# Patient Record
Sex: Male | Born: 1970 | Race: Asian | Hispanic: No | Marital: Married | Smoking: Never smoker
Health system: Southern US, Community
[De-identification: ages and names within clinical notes are randomized; demographics above are authoritative.]

## PROBLEM LIST (undated history)

## (undated) DIAGNOSIS — E7800 Pure hypercholesterolemia, unspecified: Secondary | ICD-10-CM

## (undated) DIAGNOSIS — I10 Essential (primary) hypertension: Secondary | ICD-10-CM

## (undated) HISTORY — PX: OTHER SURGICAL HISTORY: SHX169

## (undated) HISTORY — PX: KIDNEY CYST REMOVAL: SHX684

---

## 2005-05-30 ENCOUNTER — Ambulatory Visit: Payer: Self-pay | Admitting: Internal Medicine

## 2005-06-05 ENCOUNTER — Ambulatory Visit: Payer: Self-pay | Admitting: Internal Medicine

## 2005-06-11 ENCOUNTER — Ambulatory Visit: Payer: Self-pay | Admitting: Cardiology

## 2005-06-27 ENCOUNTER — Ambulatory Visit: Payer: Self-pay | Admitting: Internal Medicine

## 2005-07-19 ENCOUNTER — Ambulatory Visit: Payer: Self-pay | Admitting: Internal Medicine

## 2005-07-19 ENCOUNTER — Encounter: Payer: Self-pay | Admitting: Emergency Medicine

## 2005-07-20 ENCOUNTER — Ambulatory Visit: Payer: Self-pay | Admitting: Internal Medicine

## 2005-07-20 ENCOUNTER — Observation Stay (HOSPITAL_COMMUNITY): Admission: AD | Admit: 2005-07-20 | Discharge: 2005-07-21 | Payer: Self-pay | Admitting: Internal Medicine

## 2005-07-25 ENCOUNTER — Ambulatory Visit: Payer: Self-pay | Admitting: Internal Medicine

## 2005-07-26 ENCOUNTER — Emergency Department (HOSPITAL_COMMUNITY): Admission: EM | Admit: 2005-07-26 | Discharge: 2005-07-26 | Payer: Self-pay | Admitting: Emergency Medicine

## 2005-08-06 ENCOUNTER — Ambulatory Visit: Payer: Self-pay | Admitting: Pulmonary Disease

## 2005-08-08 ENCOUNTER — Ambulatory Visit: Payer: Self-pay | Admitting: Internal Medicine

## 2005-08-31 ENCOUNTER — Ambulatory Visit (HOSPITAL_COMMUNITY): Admission: RE | Admit: 2005-08-31 | Discharge: 2005-09-01 | Payer: Self-pay | Admitting: Otolaryngology

## 2005-09-13 ENCOUNTER — Ambulatory Visit: Payer: Self-pay | Admitting: Internal Medicine

## 2005-09-20 ENCOUNTER — Ambulatory Visit: Payer: Self-pay | Admitting: Internal Medicine

## 2005-09-24 ENCOUNTER — Ambulatory Visit (HOSPITAL_COMMUNITY): Admission: RE | Admit: 2005-09-24 | Discharge: 2005-09-24 | Payer: Self-pay | Admitting: Internal Medicine

## 2005-10-03 ENCOUNTER — Ambulatory Visit: Payer: Self-pay

## 2005-10-10 ENCOUNTER — Ambulatory Visit: Payer: Self-pay | Admitting: Internal Medicine

## 2007-12-09 ENCOUNTER — Encounter: Payer: Self-pay | Admitting: *Deleted

## 2007-12-09 DIAGNOSIS — J019 Acute sinusitis, unspecified: Secondary | ICD-10-CM | POA: Insufficient documentation

## 2007-12-09 DIAGNOSIS — Z8669 Personal history of other diseases of the nervous system and sense organs: Secondary | ICD-10-CM | POA: Insufficient documentation

## 2007-12-09 DIAGNOSIS — F411 Generalized anxiety disorder: Secondary | ICD-10-CM | POA: Insufficient documentation

## 2007-12-09 DIAGNOSIS — J309 Allergic rhinitis, unspecified: Secondary | ICD-10-CM | POA: Insufficient documentation

## 2010-12-28 ENCOUNTER — Emergency Department (INDEPENDENT_AMBULATORY_CARE_PROVIDER_SITE_OTHER): Payer: BC Managed Care – PPO

## 2010-12-28 ENCOUNTER — Emergency Department (HOSPITAL_BASED_OUTPATIENT_CLINIC_OR_DEPARTMENT_OTHER)
Admission: EM | Admit: 2010-12-28 | Discharge: 2010-12-28 | Disposition: A | Discharge status: Other Acute Inpatient Hospital | Payer: BC Managed Care – PPO | Attending: Emergency Medicine | Admitting: Emergency Medicine

## 2010-12-28 DIAGNOSIS — R079 Chest pain, unspecified: Secondary | ICD-10-CM | POA: Insufficient documentation

## 2010-12-28 DIAGNOSIS — I10 Essential (primary) hypertension: Secondary | ICD-10-CM | POA: Insufficient documentation

## 2010-12-28 LAB — COMPREHENSIVE METABOLIC PANEL
ALT: 17 U/L (ref 0–53)
AST: 16 U/L (ref 0–37)
CO2: 25 mEq/L (ref 19–32)
Calcium: 9.6 mg/dL (ref 8.4–10.5)
GFR calc Af Amer: >60 mL/min (ref >60)
GFR calc non Af Amer: >60 mL/min (ref >60)
Potassium: 3.6 mEq/L (ref 3.5–5.1)
Sodium: 135 mEq/L (ref 135–145)

## 2010-12-28 LAB — TROPONIN I: Troponin I: <0.3 ng/mL (ref <0.30)

## 2010-12-28 LAB — CK TOTAL AND CKMB (NOT AT ARMC)
CK, MB: 0.8 ng/mL (ref 0.3–4.0)
Total CK: 76 U/L (ref 7–232)

## 2010-12-28 LAB — DIFFERENTIAL
Basophils Absolute: 0.1 10*3/uL (ref 0.0–0.1)
Basophils Relative: 1 % (ref 0–1)
Neutro Abs: 3.5 10*3/uL (ref 1.7–7.7)
Neutrophils Relative %: 54 % (ref 43–77)

## 2010-12-28 LAB — CBC
Hemoglobin: 14.3 g/dL (ref 13.0–17.0)
MCHC: 35.3 g/dL (ref 30.0–36.0)
RBC: 4.71 MIL/uL (ref 4.22–5.81)
WBC: 6.4 10*3/uL (ref 4.0–10.5)

## 2010-12-28 LAB — LIPASE, BLOOD: Lipase: 21 U/L (ref 11–59)

## 2017-05-09 ENCOUNTER — Emergency Department (HOSPITAL_BASED_OUTPATIENT_CLINIC_OR_DEPARTMENT_OTHER): Payer: BLUE CROSS/BLUE SHIELD

## 2017-05-09 ENCOUNTER — Emergency Department (HOSPITAL_BASED_OUTPATIENT_CLINIC_OR_DEPARTMENT_OTHER)
Admission: EM | Admit: 2017-05-09 | Discharge: 2017-05-09 | Disposition: A | Discharge status: Home / Self Care | Payer: BLUE CROSS/BLUE SHIELD | Attending: Emergency Medicine | Admitting: Emergency Medicine

## 2017-05-09 ENCOUNTER — Encounter (HOSPITAL_BASED_OUTPATIENT_CLINIC_OR_DEPARTMENT_OTHER): Payer: Self-pay | Admitting: Emergency Medicine

## 2017-05-09 DIAGNOSIS — R531 Weakness: Secondary | ICD-10-CM | POA: Insufficient documentation

## 2017-05-09 DIAGNOSIS — I10 Essential (primary) hypertension: Secondary | ICD-10-CM | POA: Insufficient documentation

## 2017-05-09 DIAGNOSIS — R42 Dizziness and giddiness: Secondary | ICD-10-CM | POA: Diagnosis present

## 2017-05-09 HISTORY — DX: Essential (primary) hypertension: I10

## 2017-05-09 HISTORY — DX: Pure hypercholesterolemia, unspecified: E78.00

## 2017-05-09 LAB — CBC WITH DIFFERENTIAL/PLATELET
Basophils Absolute: 0.1 10*3/uL (ref 0.0–0.1)
Basophils Relative: 2 %
EOS ABS: 0.8 10*3/uL — AB (ref 0.0–0.7)
EOS PCT: 15 %
HCT: 41.5 % (ref 39.0–52.0)
HEMOGLOBIN: 14.5 g/dL (ref 13.0–17.0)
LYMPHS ABS: 1.8 10*3/uL (ref 0.7–4.0)
LYMPHS PCT: 33 %
MCH: 30.9 pg (ref 26.0–34.0)
MCHC: 34.9 g/dL (ref 30.0–36.0)
MCV: 88.3 fL (ref 78.0–100.0)
MONOS PCT: 11 %
Monocytes Absolute: 0.6 10*3/uL (ref 0.1–1.0)
NEUTROS PCT: 39 %
Neutro Abs: 2.2 10*3/uL (ref 1.7–7.7)
Platelets: 206 10*3/uL (ref 150–400)
RBC: 4.7 MIL/uL (ref 4.22–5.81)
RDW: 13.8 % (ref 11.5–15.5)
WBC: 5.5 10*3/uL (ref 4.0–10.5)

## 2017-05-09 LAB — URINALYSIS, ROUTINE W REFLEX MICROSCOPIC
Bilirubin Urine: NEGATIVE
GLUCOSE, UA: NEGATIVE mg/dL
HGB URINE DIPSTICK: NEGATIVE
Ketones, ur: NEGATIVE mg/dL
Leukocytes, UA: NEGATIVE
Nitrite: NEGATIVE
Protein, ur: NEGATIVE mg/dL
Specific Gravity, Urine: 1.01 (ref 1.005–1.030)
pH: 7.5 (ref 5.0–8.0)

## 2017-05-09 LAB — COMPREHENSIVE METABOLIC PANEL
ALT: 30 U/L (ref 17–63)
AST: 34 U/L (ref 15–41)
Albumin: 4.3 g/dL (ref 3.5–5.0)
Alkaline Phosphatase: 48 U/L (ref 38–126)
Anion gap: 7 (ref 5–15)
BUN: 6 mg/dL (ref 6–20)
CHLORIDE: 97 mmol/L — AB (ref 101–111)
CO2: 27 mmol/L (ref 22–32)
CREATININE: 0.99 mg/dL (ref 0.61–1.24)
Calcium: 8.9 mg/dL (ref 8.9–10.3)
GFR calc Af Amer: >60 mL/min (ref >60)
GFR calc non Af Amer: >60 mL/min (ref >60)
Glucose, Bld: 96 mg/dL (ref 65–99)
Potassium: 3.8 mmol/L (ref 3.5–5.1)
Sodium: 131 mmol/L — ABNORMAL LOW (ref 135–145)
Total Bilirubin: 0.7 mg/dL (ref 0.3–1.2)
Total Protein: 7.9 g/dL (ref 6.5–8.1)

## 2017-05-09 LAB — RAPID URINE DRUG SCREEN, HOSP PERFORMED
Amphetamines: NOT DETECTED
BARBITURATES: NOT DETECTED
Benzodiazepines: NOT DETECTED
Cocaine: NOT DETECTED
Opiates: NOT DETECTED
Tetrahydrocannabinol: NOT DETECTED

## 2017-05-09 LAB — PROTIME-INR
INR: 1.03
PROTHROMBIN TIME: 13.4 s (ref 11.4–15.2)

## 2017-05-09 LAB — TROPONIN I: Troponin I: <0.03 ng/mL (ref <0.03)

## 2017-05-09 LAB — ETHANOL

## 2017-05-09 LAB — CBG MONITORING, ED: Glucose-Capillary: 85 mg/dL (ref 65–99)

## 2017-05-09 LAB — APTT: APTT: 29 s (ref 24–36)

## 2017-05-09 MED ORDER — SODIUM CHLORIDE 0.9 % IV BOLUS (SEPSIS)
1000.0000 mL | Freq: Once | INTRAVENOUS | Status: AC
  Administered 2017-05-09: 1000 mL via INTRAVENOUS

## 2017-05-09 MED ORDER — MECLIZINE HCL 25 MG PO TABS
25.0000 mg | ORAL_TABLET | Freq: Once | ORAL | Status: AC
  Administered 2017-05-09: 25 mg via ORAL
  Filled 2017-05-09: qty 1

## 2017-05-09 MED ORDER — MECLIZINE HCL 25 MG PO TABS: 25.0000 mg | ORAL_TABLET | Freq: Three times a day (TID) | ORAL | 0 refills | Status: DC | PRN

## 2017-05-09 MED ORDER — SODIUM CHLORIDE 0.9 % IV SOLN: INTRAVENOUS | Status: DC

## 2017-05-09 NOTE — ED Provider Notes (Addendum)
[facility] Provider Note   CSN: [id number] Arrival date & time: 05/09/17  1440     History   Chief Complaint Chief Complaint  Patient presents with  . Dizziness    HPI Robert Garrett is a 46 y.o. male.  Patient with acute onset of dizziness without room spinning at 2:00 this afternoon. Associated with feeling of being weak in both legs. No back pain no neck pain no headache no chest pain no abdominal pain. No fevers. Did have earlier in the week some chest congestion and cough. Did use albuterol because he felt he was maybe having some wheezing. This is been present for about 2 weeks. Patient denies any speech problems. No history of anything similar. No visual changes.      Past Medical History:  Diagnosis Date  . Hypercholesteremia   . Hypertension     Patient Active Problem List   Diagnosis Date Noted  . ANXIETY 12/09/2007  . SINUSITIS, ACUTE 12/09/2007  . ALLERGIC RHINITIS 12/09/2007  . VERTIGO, HX OF 12/09/2007    Past Surgical History:  Procedure Laterality Date  . sinus surgery         Home Medications    Prior to Admission medications   Medication Sig Start Date End Date Taking? Authorizing Provider  losartan-hydrochlorothiazide (HYZAAR) 50-12.5 MG tablet Take 1 tablet by mouth daily.   Yes [provider]  meclizine (ANTIVERT) 25 MG tablet Take 1 tablet (25 mg total) by mouth 3 (three) times daily as needed for dizziness. 05/09/17   Fredia Sorrow, MD    Family History History reviewed. No pertinent family history.  Social History Social History  Substance Use Topics  . Smoking status: Never Smoker  . Smokeless tobacco: Never Used  . Alcohol use Yes     Comment: rarely     Allergies   Patient has no known allergies.   Review of Systems Review of Systems  Constitutional: Negative for fever.  HENT: Positive for congestion.   Eyes: Negative for visual disturbance.  Respiratory: Positive for cough and wheezing.     Cardiovascular: Negative for chest pain.  Gastrointestinal: Negative for abdominal pain.  Genitourinary: Negative for dysuria.  Musculoskeletal: Negative for myalgias.  Neurological: Positive for dizziness, weakness and light-headedness. Negative for seizures, syncope, facial asymmetry, speech difficulty, numbness and headaches.  Hematological: Does not bruise/bleed easily.  Psychiatric/Behavioral: Negative for confusion.     Physical Exam Updated Vital Signs BP (!) 131/97   Pulse 87   Temp 98.1 F (36.7 C)   Resp 16   Ht 1.702 m (5\' 7" )   Wt 90.7 kg (200 lb)   SpO2 95%   BMI 31.32 kg/m   Physical Exam  Constitutional: He is oriented to person, place, and time. He appears well-developed and well-nourished. No distress.  HENT:  Head: Normocephalic and atraumatic.  Mouth/Throat: Oropharynx is clear and moist.  Eyes: Pupils are equal, round, and reactive to light. Conjunctivae and EOM are normal.  No nystagmus.  Neck: Normal range of motion. Neck supple.  Cardiovascular: Normal rate, regular rhythm and normal heart sounds.   Pulmonary/Chest: Effort normal and breath sounds normal. He has no wheezes.  Abdominal: Soft. Bowel sounds are normal.  Musculoskeletal: Normal range of motion. He exhibits no edema.  Neurological: He is alert and oriented to person, place, and time. No cranial nerve deficit. He exhibits abnormal muscle tone. Coordination normal.  Mild bilateral upper extremity and lower extremity weakness.  Skin: Skin is warm. No rash  noted.  Nursing note and vitals reviewed.    ED Treatments / Results  Labs (all labs ordered are listed, but only abnormal results are displayed) Labs Reviewed  COMPREHENSIVE METABOLIC PANEL - Abnormal; Notable for the following:       Result Value   Sodium 131 (*)    Chloride 97 (*)    All other components within normal limits  CBC WITH DIFFERENTIAL/PLATELET - Abnormal; Notable for the following:    Eosinophils Absolute 0.8 (*)     All other components within normal limits  URINALYSIS, ROUTINE W REFLEX MICROSCOPIC  ETHANOL  PROTIME-INR  APTT  TROPONIN I  RAPID URINE DRUG SCREEN, HOSP PERFORMED  CBG MONITORING, ED    EKG  EKG Interpretation  Date/Time:  Thursday May 09 2017 15:07:06 EDT Ventricular Rate:  85 PR Interval:    QRS Duration: 100 QT Interval:  372 QTC Calculation: 443 R Axis:   89 Text Interpretation:  Sinus rhythm Abnormal R-wave progression, early transition ST elevation, consider inferior injury No significant change since last tracing Confirmed by Fredia Sorrow [phone]) on 05/09/2017 4:54:12 PM       Radiology Dg Chest 2 View  Result Date: 05/09/2017 CLINICAL DATA:  Acute onset dizziness. EXAM: CHEST  2 VIEW COMPARISON:  07/22/2016 FINDINGS: The lungs are clear without focal pneumonia, edema, pneumothorax or pleural effusion. The cardiopericardial silhouette is within normal limits for size. The visualized bony structures of the thorax are intact. Telemetry leads overlie the chest. IMPRESSION: No active cardiopulmonary disease. Electronically Signed   By: Misty Stanley M.D.   On: 05/09/2017 16:48   Ct Head Wo Contrast  Result Date: 05/09/2017 CLINICAL DATA:  Dizziness. EXAM: CT HEAD WITHOUT CONTRAST TECHNIQUE: Contiguous axial images were obtained from the base of the skull through the vertex without intravenous contrast. COMPARISON:  MRI brain dated September 24, 2005. CT head dated July 20, 2005. FINDINGS: Brain: No evidence of acute infarction, hemorrhage, hydrocephalus, extra-axial collection or mass lesion/mass effect. Vascular: No hyperdense vessel or unexpected calcification. Skull: Normal. Negative for fracture or focal lesion. Sinuses/Orbits: Pansinusitis and polyposis again noted. Small air-fluid level in the right maxillary sinus. The left sphenoid sinus is relatively clear. The mastoid air cells are clear. The orbits are unremarkable. Other: None. IMPRESSION: 1.  No acute  intracranial abnormality. 2. Pain sinusitis and polyposis again noted with small air-fluid level in the right maxillary sinus. Electronically Signed   By: Titus Dubin M.D.   On: 05/09/2017 16:51    Procedures Procedures (including critical care time)  Medications Ordered in ED Medications  0.9 %  sodium chloride infusion (not administered)  sodium chloride 0.9 % bolus 1,000 mL (1,000 mLs Intravenous New Bag/Given 05/09/17 1533)  meclizine (ANTIVERT) tablet 25 mg (25 mg Oral Given 05/09/17 1552)     Initial Impression / Assessment and Plan / ED Course  I have reviewed the triage vital signs and the nursing notes.  Pertinent labs & imaging results that were available during my care of the patient were reviewed by me and considered in my medical decision making (see chart for details).    Patient with symptoms during the week suggestive of perhaps a viral upper respiratory infection or some exacerbation of reactive airway disease. Patient with dizziness and felt as if he was given a fall over but no room spinning. No complaint of any pain. Head CT negative chest x-ray negative for pneumonia labs without significant abnormalities other than some mild hyponatremia. Symptoms could be viral based.  Patient felt better after fluids and meclizine. Patient able to ambulate without any symptoms.  Patient informed that stroke not completely ruled out. The patient without any other symptoms other than the dizziness did have some mild lower extremity weakness but also had some mild upper extremity weakness both bilaterally. Feel that stroke is unlikely. MRI not available do not feel patient needs to be transferred to have MRI. Patient will follow-up with primary care doctor. Patient will be continued on the meclizine.   Final Clinical Impressions(s) / ED Diagnoses   Final diagnoses:  Dizziness    New Prescriptions New Prescriptions   MECLIZINE (ANTIVERT) 25 MG TABLET    Take 1 tablet (25 mg  total) by mouth 3 (three) times daily as needed for dizziness.     Fredia Sorrow, MD 05/09/17 1810    Fredia Sorrow, MD 05/09/17 Vernelle Emerald

## 2017-05-09 NOTE — ED Notes (Signed)
Pt ambulated around the unit without any difficulty or dizziness with this NT (Sneha Willig). RN (Joss) notified.

## 2017-05-09 NOTE — Discharge Instructions (Signed)
Make appointment to follow-up with your regular doctor. 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 Take it easy. Return for any new or worse symptoms. Chest x-ray negative head CT negative. Symptoms may be secondary to a virus. Try to increase fluid intake.

## 2017-05-09 NOTE — ED Triage Notes (Signed)
Patient states that he was at work and started to develop acute onset of dizziness. The patient denies any other complaints. Reports that he has a "cough" x 2 weeks. Denies any Chest pain, SOB - Neuro intact with the exception of the dizziness. Denies any Headache

## 2017-05-09 NOTE — ED Notes (Signed)
Family at bedside. 

## 2017-05-09 NOTE — ED Notes (Signed)
ED Provider at bedside. 

## 2018-10-17 IMAGING — CR DG CHEST 2V
2 series · 2 of 2 positions shown · non-contrast
Comparison: 07/22/2016

CLINICAL DATA: Acute onset dizziness.

EXAM:
CHEST  2 VIEW

[w chest pa]
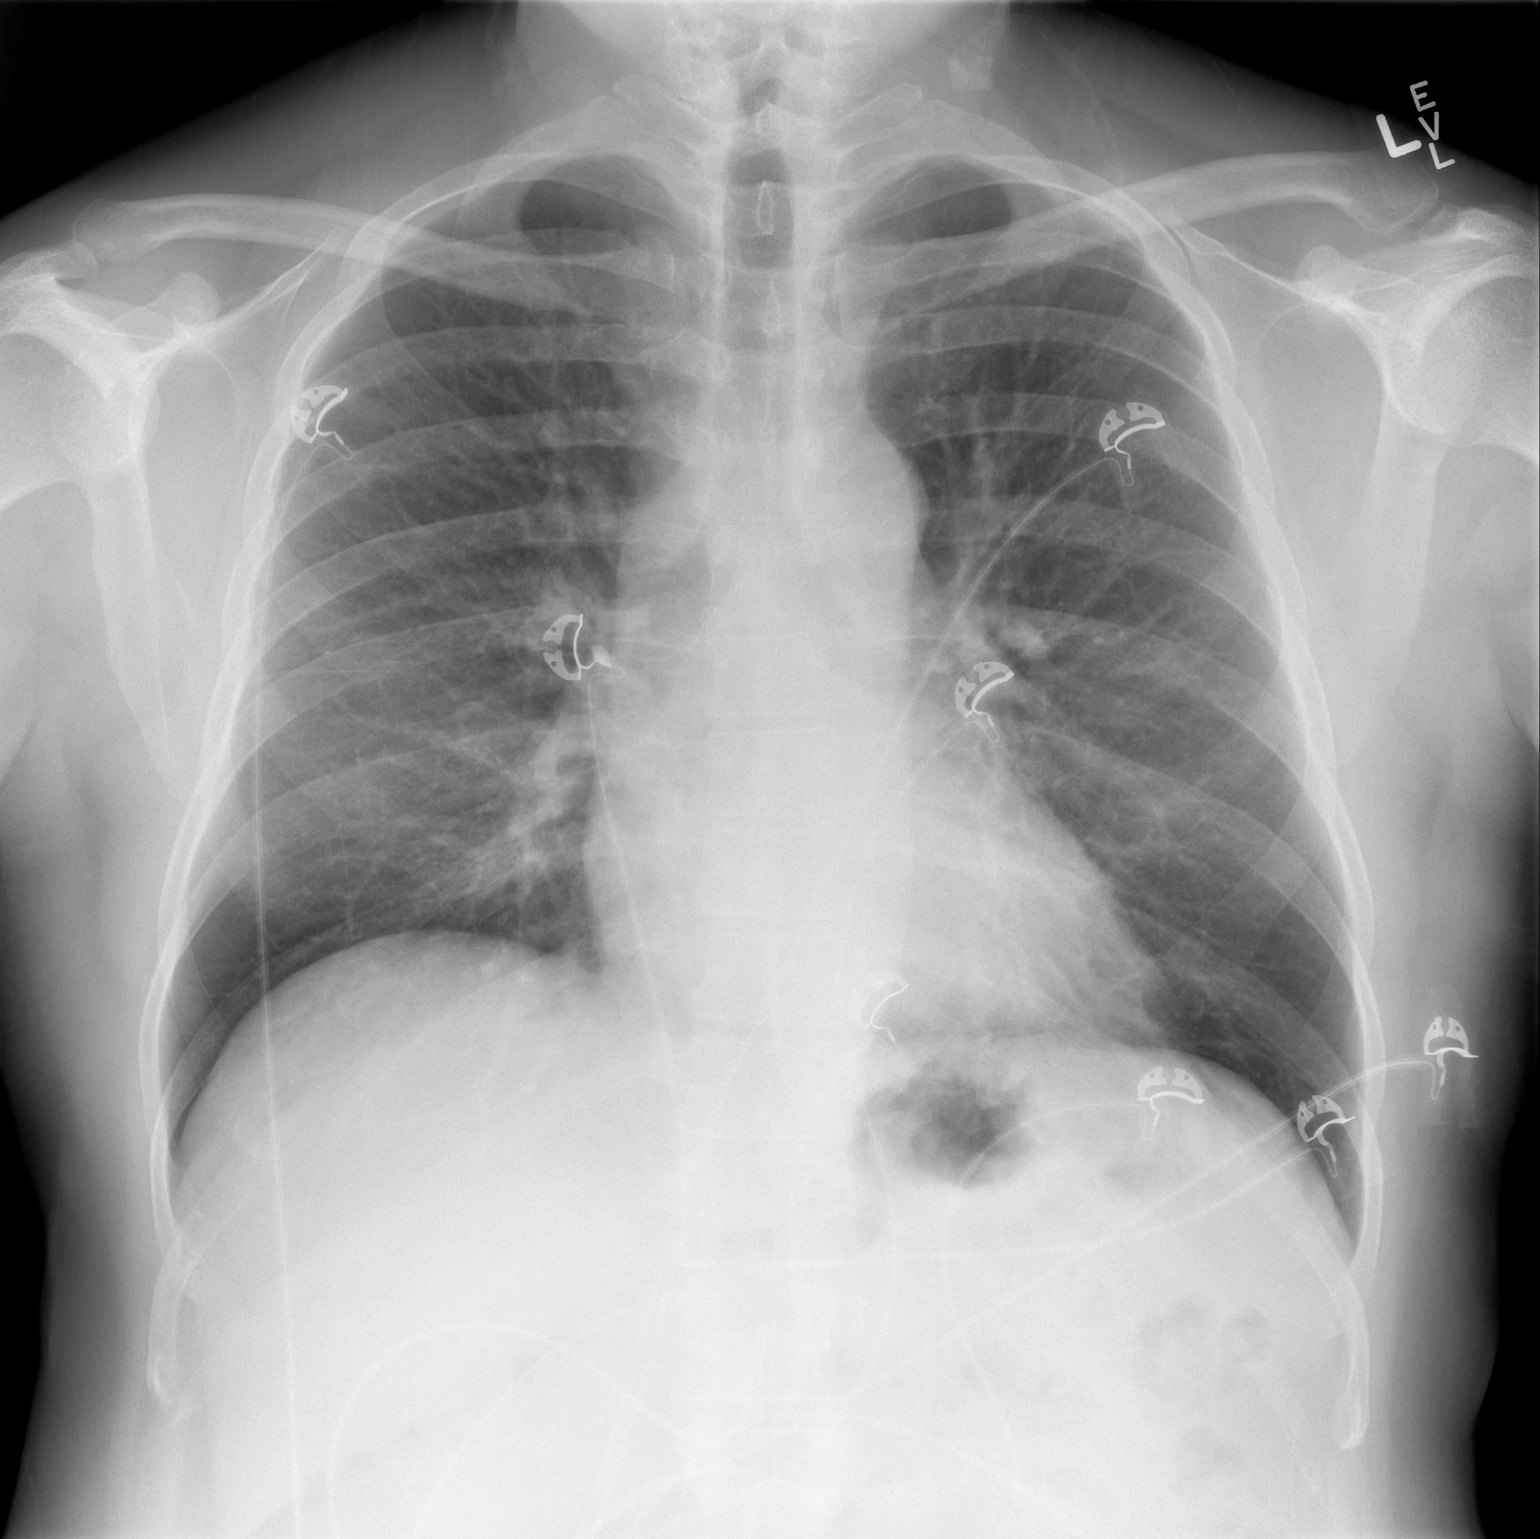

[w chest lat]
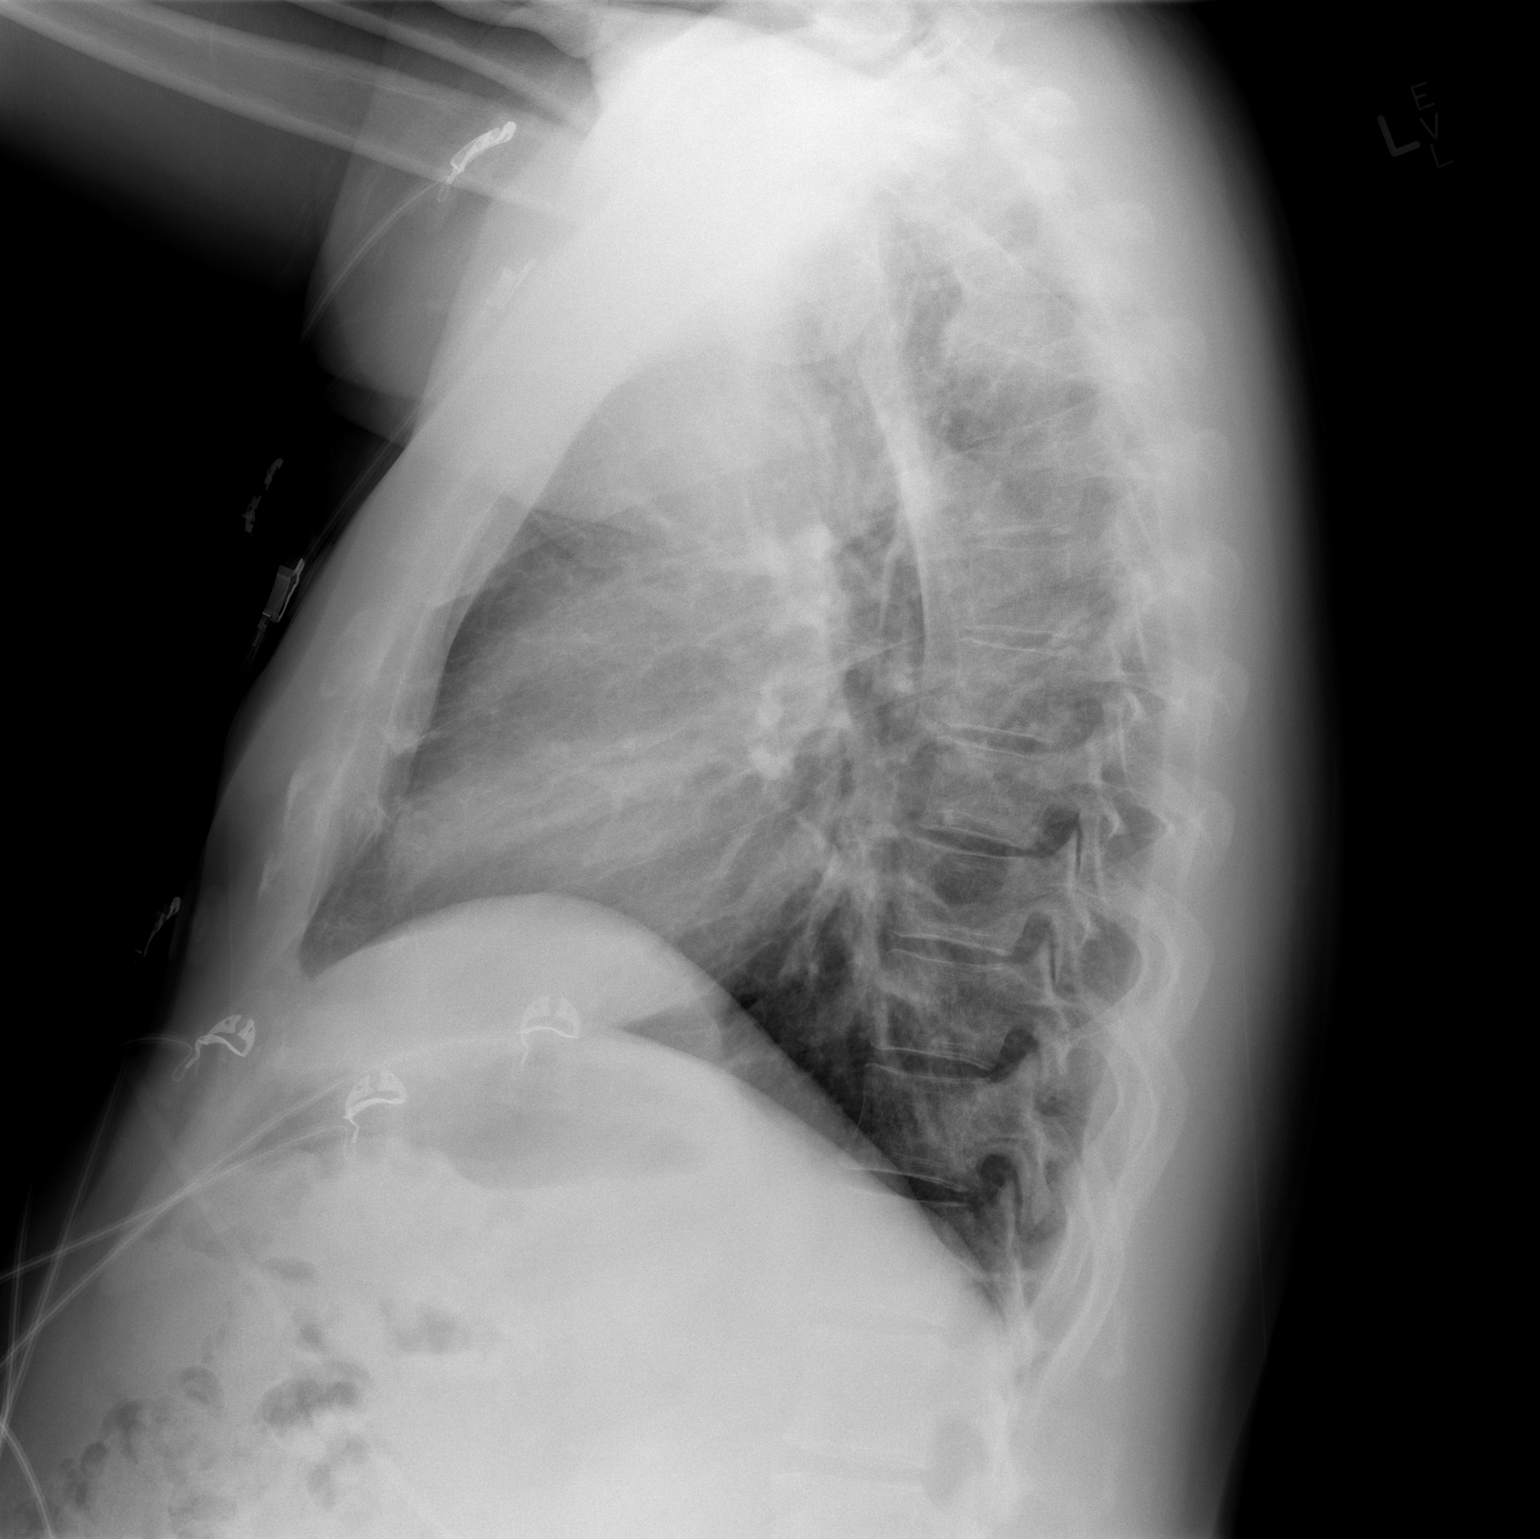

[2 of 2 positions shown; findings below may reference images not displayed]

FINDINGS: The lungs are clear without focal pneumonia, edema, pneumothorax or
pleural effusion. The cardiopericardial silhouette is within normal
limits for size. The visualized bony structures of the thorax are
intact. Telemetry leads overlie the chest.
IMPRESSION: No active cardiopulmonary disease.

## 2018-10-17 IMAGING — CT CT HEAD W/O CM
3 series · 16 of 47 positions shown, 19 images · non-contrast
Comparison: MRI brain dated September 24, 2005. CT head dated
July 20, 2005.

CLINICAL DATA: Dizziness.

EXAM:
CT HEAD WITHOUT CONTRAST
TECHNIQUE: Contiguous axial images were obtained from the base of the skull
through the vertex without intravenous contrast.

[Series 2: head wo · axial · 0.41mm/px · z∈[+1237,+1377]mm · 10 of 34 slices shown, 13 images]
[im 3/34  brain]
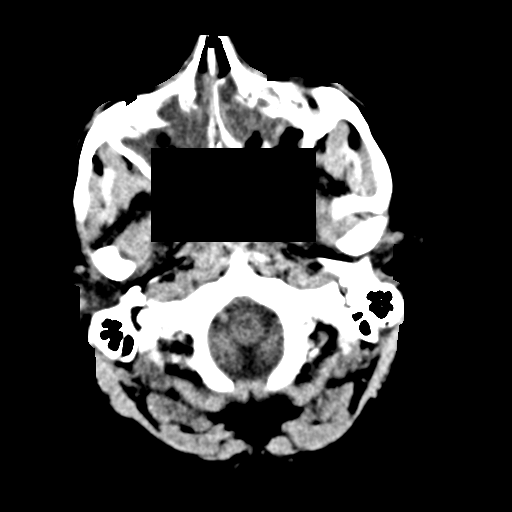
[im 3/34  bone]
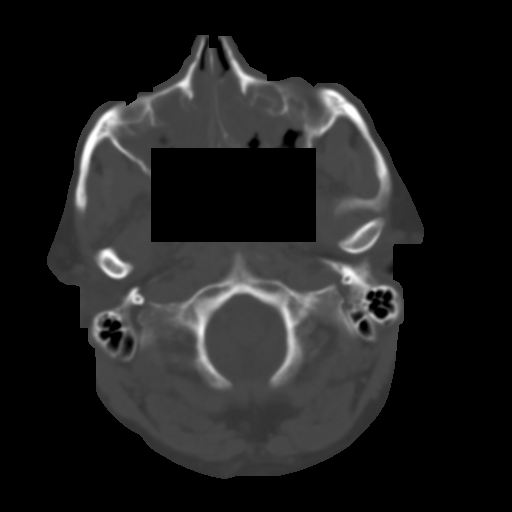
[im 6/34  brain]
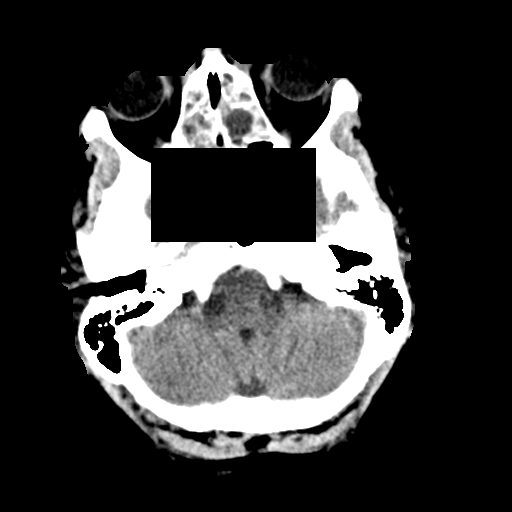
[im 10/34  brain]
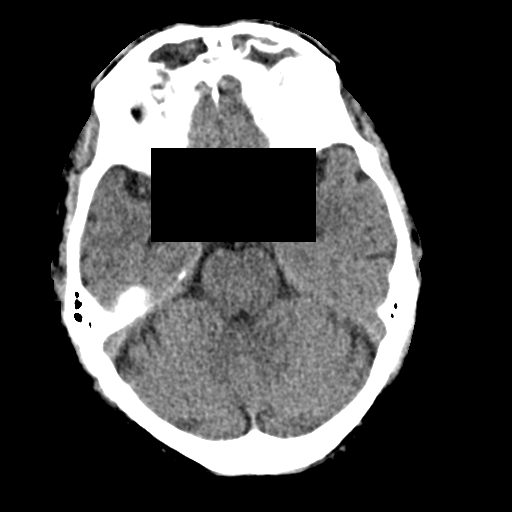
[im 12/34  brain]
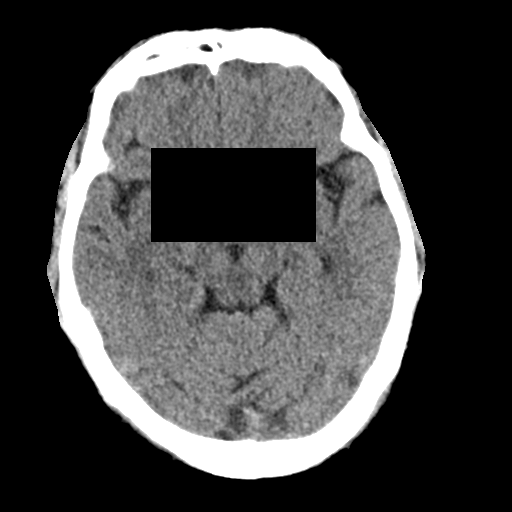
[im 15/34  brain]
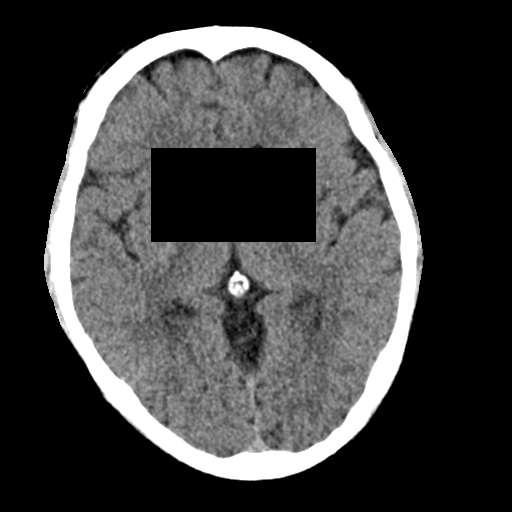
[im 15/34  bone]
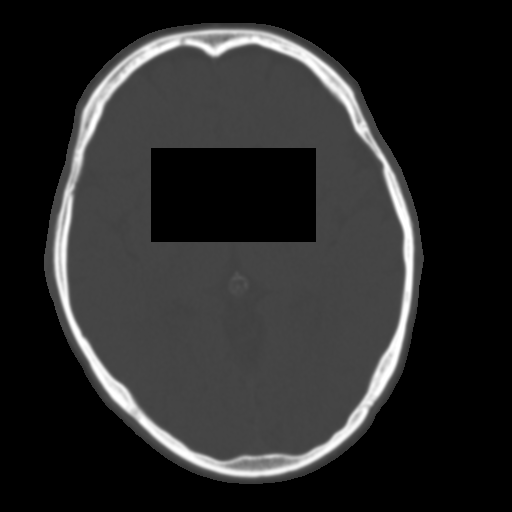
[im 19/34  brain]
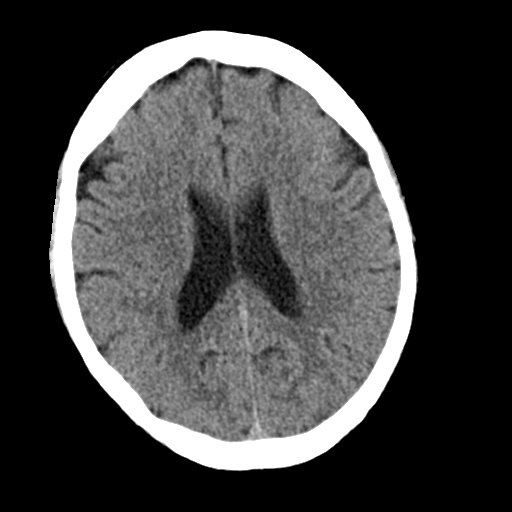
[im 22/34  brain]
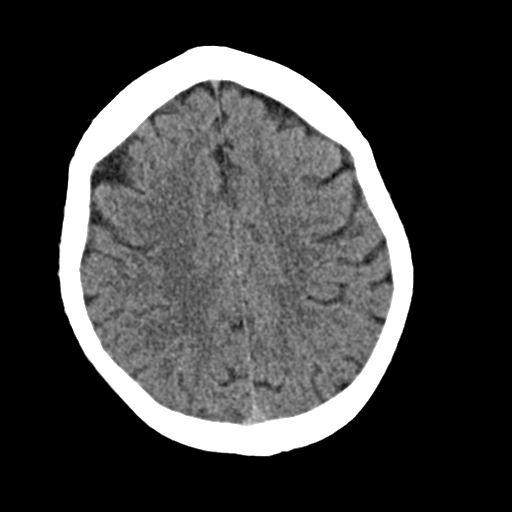
[im 26/34  brain]
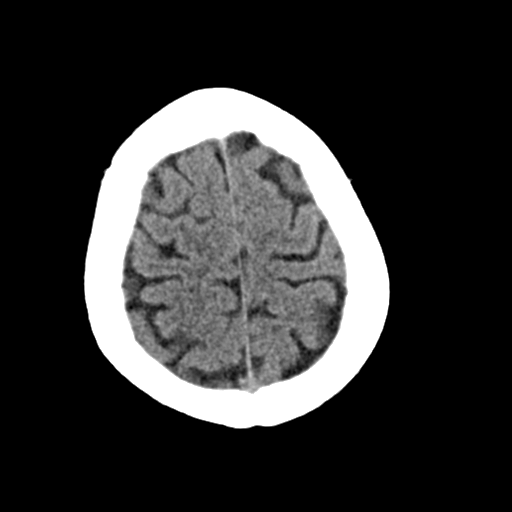
[im 28/34  brain]
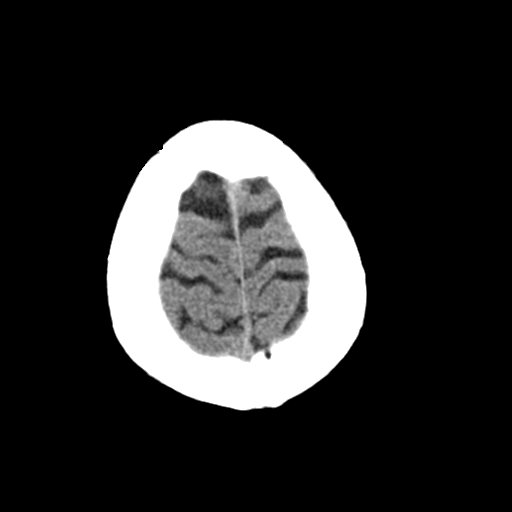
[im 28/34  bone]
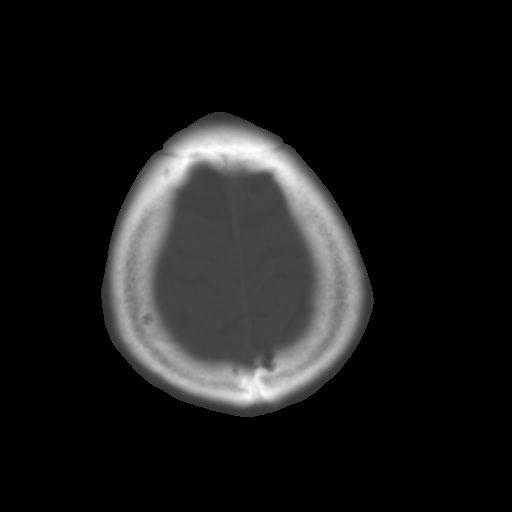
[im 31/34  brain]
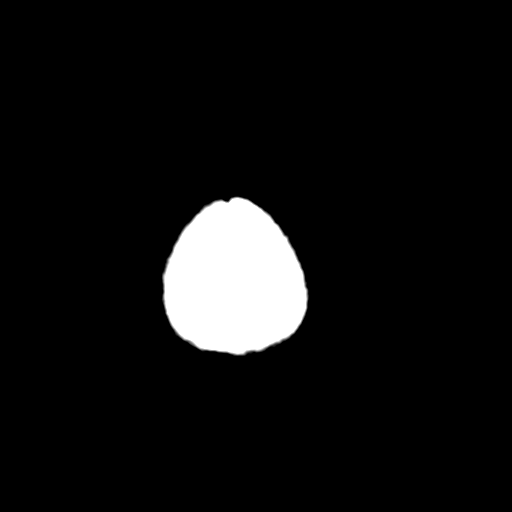

[Series 4: coronal soft · coronal · 0.33mm/px · 3 of 67 slices shown]
[im 23/67  brain]
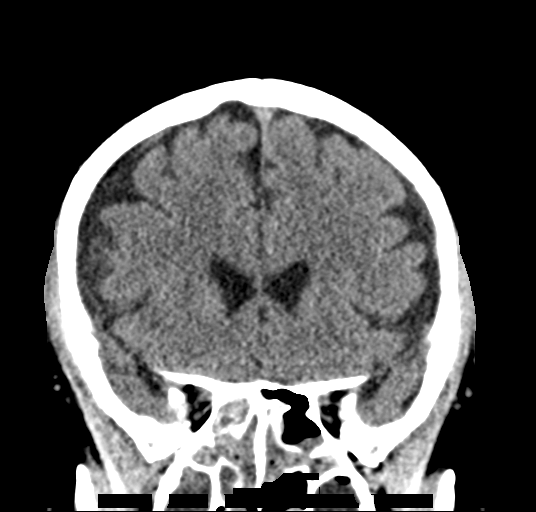
[im 30/67  brain]
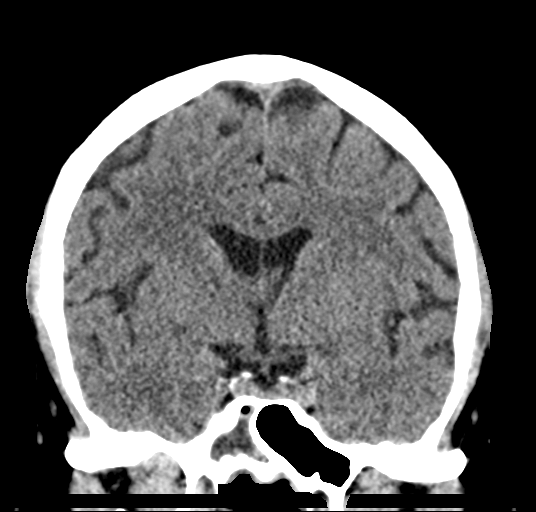
[im 37/67  brain]
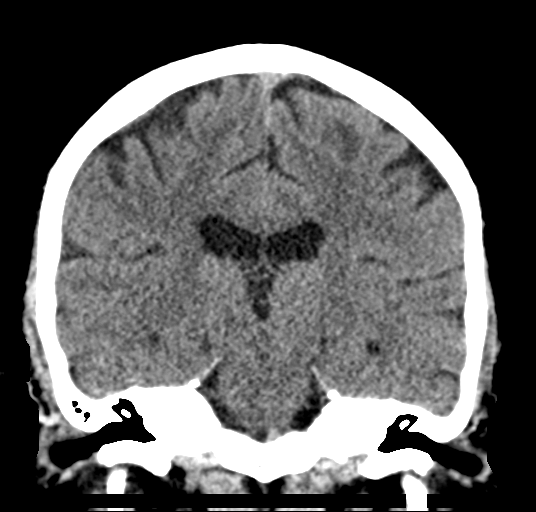

[Series 5: sag soft · sagittal · 0.33mm/px · 3 of 60 slices shown]
[im 20/60  brain]
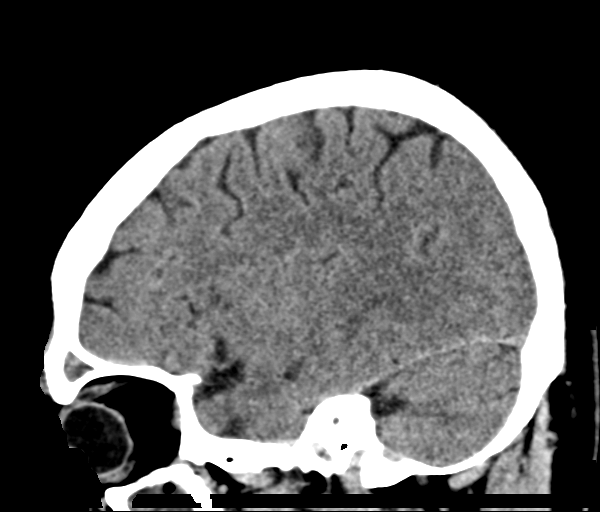
[im 30/60  brain]
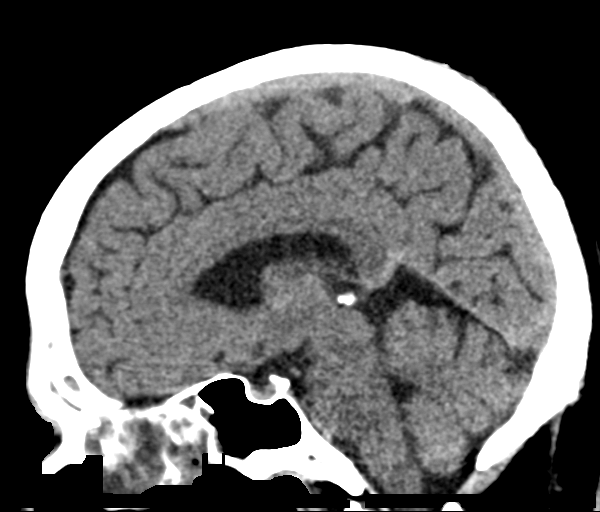
[im 40/60  brain]
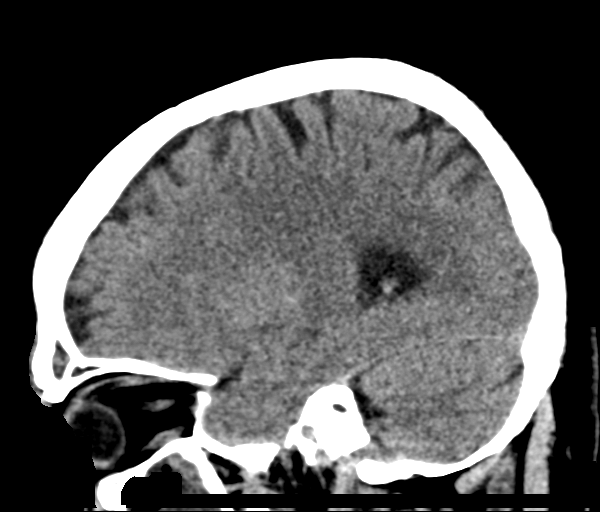

[16 of 47 positions shown; findings below may reference images not displayed]

FINDINGS: Brain: No evidence of acute infarction, hemorrhage, hydrocephalus,
extra-axial collection or mass lesion/mass effect.

Vascular: No hyperdense vessel or unexpected calcification.

Skull: Normal. Negative for fracture or focal lesion.

Sinuses/Orbits: Pansinusitis and polyposis again noted. Small
air-fluid level in the right maxillary sinus. The left sphenoid
sinus is relatively clear. The mastoid air cells are clear. The
orbits are unremarkable.

Other: None.
IMPRESSION: 1.  No acute intracranial abnormality.
2. Pain sinusitis and polyposis again noted with small air-fluid
level in the right maxillary sinus.

## 2020-10-20 ENCOUNTER — Encounter (HOSPITAL_BASED_OUTPATIENT_CLINIC_OR_DEPARTMENT_OTHER): Payer: Self-pay

## 2020-10-20 ENCOUNTER — Emergency Department (HOSPITAL_BASED_OUTPATIENT_CLINIC_OR_DEPARTMENT_OTHER): Payer: BC Managed Care – PPO

## 2020-10-20 ENCOUNTER — Other Ambulatory Visit: Payer: Self-pay

## 2020-10-20 ENCOUNTER — Emergency Department (HOSPITAL_BASED_OUTPATIENT_CLINIC_OR_DEPARTMENT_OTHER)
Admission: EM | Admit: 2020-10-20 | Discharge: 2020-10-20 | Disposition: A | Discharge status: Home / Self Care | Payer: BC Managed Care – PPO | Attending: Emergency Medicine | Admitting: Emergency Medicine

## 2020-10-20 DIAGNOSIS — R42 Dizziness and giddiness: Secondary | ICD-10-CM | POA: Diagnosis not present

## 2020-10-20 DIAGNOSIS — R112 Nausea with vomiting, unspecified: Secondary | ICD-10-CM

## 2020-10-20 DIAGNOSIS — R000 Tachycardia, unspecified: Secondary | ICD-10-CM | POA: Insufficient documentation

## 2020-10-20 DIAGNOSIS — R61 Generalized hyperhidrosis: Secondary | ICD-10-CM | POA: Diagnosis not present

## 2020-10-20 DIAGNOSIS — Z20822 Contact with and (suspected) exposure to covid-19: Secondary | ICD-10-CM | POA: Insufficient documentation

## 2020-10-20 DIAGNOSIS — R1013 Epigastric pain: Secondary | ICD-10-CM

## 2020-10-20 DIAGNOSIS — Z79899 Other long term (current) drug therapy: Secondary | ICD-10-CM | POA: Insufficient documentation

## 2020-10-20 DIAGNOSIS — E876 Hypokalemia: Secondary | ICD-10-CM | POA: Diagnosis not present

## 2020-10-20 DIAGNOSIS — R197 Diarrhea, unspecified: Secondary | ICD-10-CM | POA: Insufficient documentation

## 2020-10-20 DIAGNOSIS — I10 Essential (primary) hypertension: Secondary | ICD-10-CM | POA: Insufficient documentation

## 2020-10-20 LAB — BASIC METABOLIC PANEL
Anion gap: 13 (ref 5–15)
BUN: 7 mg/dL (ref 6–20)
CO2: 26 mmol/L (ref 22–32)
Calcium: 9.3 mg/dL (ref 8.9–10.3)
Chloride: 94 mmol/L — ABNORMAL LOW (ref 98–111)
Creatinine, Ser: 0.99 mg/dL (ref 0.61–1.24)
GFR, Estimated: >60 mL/min (ref >60)
Glucose, Bld: 101 mg/dL — ABNORMAL HIGH (ref 70–99)
Potassium: 3.1 mmol/L — ABNORMAL LOW (ref 3.5–5.1)
Sodium: 133 mmol/L — ABNORMAL LOW (ref 135–145)

## 2020-10-20 LAB — CBC
HCT: 46.1 % (ref 39.0–52.0)
Hemoglobin: 15.7 g/dL (ref 13.0–17.0)
MCH: 30.3 pg (ref 26.0–34.0)
MCHC: 34.1 g/dL (ref 30.0–36.0)
MCV: 89 fL (ref 80.0–100.0)
Platelets: 246 10*3/uL (ref 150–400)
RBC: 5.18 MIL/uL (ref 4.22–5.81)
RDW: 13.6 % (ref 11.5–15.5)
WBC: 9.4 10*3/uL (ref 4.0–10.5)
nRBC: 0 % (ref 0.0–0.2)

## 2020-10-20 LAB — HEPATIC FUNCTION PANEL
ALT: 19 U/L (ref 0–44)
AST: 28 U/L (ref 15–41)
Albumin: 5.1 g/dL — ABNORMAL HIGH (ref 3.5–5.0)
Alkaline Phosphatase: 43 U/L (ref 38–126)
Bilirubin, Direct: 0.2 mg/dL (ref 0.0–0.2)
Indirect Bilirubin: 0.7 mg/dL (ref 0.3–0.9)
Total Bilirubin: 0.9 mg/dL (ref 0.3–1.2)
Total Protein: 8.9 g/dL — ABNORMAL HIGH (ref 6.5–8.1)

## 2020-10-20 LAB — TROPONIN I (HIGH SENSITIVITY): Troponin I (High Sensitivity): <2 ng/L (ref <18)

## 2020-10-20 LAB — LIPASE, BLOOD: Lipase: 32 U/L (ref 11–51)

## 2020-10-20 MED ORDER — POTASSIUM CHLORIDE ER 10 MEQ PO TBCR: 10.0000 meq | EXTENDED_RELEASE_TABLET | Freq: Two times a day (BID) | ORAL | 0 refills | Status: DC

## 2020-10-20 MED ORDER — DICYCLOMINE HCL 20 MG PO TABS: 20.0000 mg | ORAL_TABLET | Freq: Three times a day (TID) | ORAL | 0 refills | Status: DC | PRN

## 2020-10-20 MED ORDER — ONDANSETRON HCL 4 MG/2ML IJ SOLN
4.0000 mg | Freq: Once | INTRAMUSCULAR | Status: AC
  Administered 2020-10-20: 4 mg via INTRAVENOUS
  Filled 2020-10-20: qty 2

## 2020-10-20 MED ORDER — SODIUM CHLORIDE 0.9 % IV SOLN: INTRAVENOUS | Status: DC | PRN

## 2020-10-20 MED ORDER — ONDANSETRON 4 MG PO TBDP: 4.0000 mg | ORAL_TABLET | Freq: Three times a day (TID) | ORAL | 0 refills | Status: DC | PRN

## 2020-10-20 MED ORDER — POTASSIUM CHLORIDE 10 MEQ/100ML IV SOLN
10.0000 meq | Freq: Once | INTRAVENOUS | Status: AC
  Administered 2020-10-20: 10 meq via INTRAVENOUS
  Filled 2020-10-20: qty 100

## 2020-10-20 MED ORDER — SODIUM CHLORIDE 0.9 % IV BOLUS
1000.0000 mL | Freq: Once | INTRAVENOUS | Status: AC
  Administered 2020-10-20: 1000 mL via INTRAVENOUS

## 2020-10-20 NOTE — ED Notes (Signed)
ED Provider at bedside. 

## 2020-10-20 NOTE — ED Provider Notes (Signed)
[facility] DEPART[facility] Provider Note   CSN: [id number] Arrival date & time: 10/20/20  1519     History Chief Complaint  Patient presents with  . Weakness  . Abdominal Pain    Robert Garrett is a 50 y.o. male.  HPI Patient presents with nausea vomiting dizziness/lightheadedness.  Began today while coming home from lunch.  Reportedly began to feel lightheaded.  Felt as if he could pass out.  Then began to have vomiting.  Has not been around anyone sick.  No blood in the emesis.  No he has been vaccinated for COVID.  No known sick contacts.  Somewhat difficult history since patient is actively vomiting.  History also comes from the patient's wife.    Past Medical History:  Diagnosis Date  . Hypercholesteremia   . Hypertension     Patient Active Problem List   Diagnosis Date Noted  . ANXIETY 12/09/2007  . SINUSITIS, ACUTE 12/09/2007  . ALLERGIC RHINITIS 12/09/2007  . VERTIGO, HX OF 12/09/2007    Past Surgical History:  Procedure Laterality Date  . sinus surgery         No family history on file.  Social History   Tobacco Use  . Smoking status: Never Smoker  . Smokeless tobacco: Never Used  Substance Use Topics  . Alcohol use: Yes    Comment: rarely  . Drug use: No    Home Medications Prior to Admission medications   Medication Sig Start Date End Date Taking? Authorizing Provider  dicyclomine (BENTYL) 20 MG tablet Take 1 tablet (20 mg total) by mouth 3 (three) times daily as needed for spasms. 10/20/20  Yes Davonna Belling, MD  ondansetron (ZOFRAN-ODT) 4 MG disintegrating tablet Take 1 tablet (4 mg total) by mouth every 8 (eight) hours as needed for nausea or vomiting. 10/20/20  Yes Davonna Belling, MD  potassium chloride (KLOR-CON) 10 MEQ tablet Take 1 tablet (10 mEq total) by mouth 2 (two) times daily. 10/20/20  Yes Davonna Belling, MD  losartan-hydrochlorothiazide (HYZAAR) 50-12.5 MG tablet Take 1 tablet by mouth daily.    [provider]  meclizine (ANTIVERT) 25 MG tablet Take 1 tablet (25 mg total) by mouth 3 (three) times daily as needed for dizziness. 05/09/17   Fredia Sorrow, MD    Allergies    Patient has no known allergies.  Review of Systems   Review of Systems  Constitutional: Positive for appetite change and diaphoresis.  HENT: Negative for congestion.   Respiratory: Negative for shortness of breath.   Cardiovascular: Negative for chest pain.  Gastrointestinal: Positive for diarrhea, nausea and vomiting.  Genitourinary: Negative for flank pain.  Musculoskeletal: Negative for back pain.  Skin: Negative for rash.  Neurological: Positive for light-headedness.  Psychiatric/Behavioral: Negative for confusion.    Physical Exam Updated Vital Signs BP 134/87   Pulse 85   Temp 97.9 F (36.6 C) (Oral)   Resp 20   Ht 5\' 7"  (1.702 m)   Wt 96.6 kg   SpO2 95%   BMI 33.36 kg/m   Physical Exam Vitals reviewed.  Constitutional:      Comments: Patient is actively vomiting   Cardiovascular:     Rate and Rhythm: Tachycardia present.  Pulmonary:     Effort: Pulmonary effort is normal.  Abdominal:     Tenderness: There is no abdominal tenderness.     Hernia: No hernia is present.  Skin:    General: Skin is warm.     Capillary Refill: Capillary  refill takes less than 2 seconds.  Neurological:     Mental Status: He is alert and oriented to person, place, and time.     ED Results / Procedures / Treatments   Labs (all labs ordered are listed, but only abnormal results are displayed) Labs Reviewed  BASIC METABOLIC PANEL - Abnormal; Notable for the following components:      Result Value   Sodium 133 (*)    Potassium 3.1 (*)    Chloride 94 (*)    Glucose, Bld 101 (*)    All other components within normal limits  HEPATIC FUNCTION PANEL - Abnormal; Notable for the following components:   Total Protein 8.9 (*)    Albumin 5.1 (*)    All other components within normal limits  SARS  CORONAVIRUS 2 (TAT 6-24 HRS)  CBC  LIPASE, BLOOD  TROPONIN I (HIGH SENSITIVITY)    EKG EKG Interpretation  Date/Time:  Thursday October 20 2020 15:22:11 EST Ventricular Rate:  96 PR Interval:  152 QRS Duration: 92 QT Interval:  372 QTC Calculation: 469 R Axis:   101 Text Interpretation: Normal sinus rhythm Possible Right ventricular hypertrophy Nonspecific ST abnormality Abnormal ECG Baseline wander Confirmed by Davonna Belling [id number] on 10/20/2020 3:45:42 PM   Radiology DG Chest Port 1 View  Result Date: 10/20/2020 CLINICAL DATA:  Epigastric abdominal pain. EXAM: PORTABLE CHEST 1 VIEW COMPARISON:  05/26/2020 FINDINGS: Normal sized heart.  Clear lungs.  Unremarkable bones. IMPRESSION: No acute abnormality. Electronically Signed   By: Claudie Revering M.D.   On: 10/20/2020 16:28    Procedures Procedures   Medications Ordered in ED Medications  0.9 %  sodium chloride infusion (has no administration in time range)  ondansetron (ZOFRAN) injection 4 mg (4 mg Intravenous Given 10/20/20 1607)  sodium chloride 0.9 % bolus 1,000 mL ( Intravenous Stopped 10/20/20 1753)  potassium chloride 10 mEq in 100 mL IVPB (0 mEq Intravenous Stopped 10/20/20 1829)    ED Course  I have reviewed the triage vital signs and the nursing notes.  Pertinent labs & imaging results that were available during my care of the patient were reviewed by me and considered in my medical decision making (see chart for details).    MDM Rules/Calculators/A&P                          Patient presents with nausea vomiting diaphoresis and a little diarrhea.  Began acutely while driving home for lunch.  Lab work showed mild hypokalemia and hyponatremia.  Supplemented with fluids and potassium.  Feels somewhat better now tolerating orals.  Doubt cardiac cause of the vomiting.  Will discharge home with treatments for the cramping antiemetics and potassium.  Outpatient follow-up as needed.  Likely gastroenteritis or other cause.   Covid test has been done to be followed as an outpatient.  Patient has been vaccinated for COVID.  Does not appear to have bowel obstruction or cardiac cause of the vomiting. Final Clinical Impression(s) / ED Diagnoses Final diagnoses:  Non-intractable vomiting with nausea, unspecified vomiting type  Hypokalemia    Rx / DC Orders ED Discharge Orders         Ordered    dicyclomine (BENTYL) 20 MG tablet  3 times daily PRN        10/20/20 1808    ondansetron (ZOFRAN-ODT) 4 MG disintegrating tablet  Every 8 hours PRN        10/20/20 1809    potassium chloride (  KLOR-CON) 10 MEQ tablet  2 times daily        10/20/20 1810           Davonna Belling, MD 10/20/20 1932

## 2020-10-20 NOTE — ED Notes (Signed)
Per EDP order, pt given fluids and/or food for PO challenge. Pt verbalized understanding to utilize call bell if nausea or emesis occur. 

## 2020-10-21 LAB — SARS CORONAVIRUS 2 (TAT 6-24 HRS): SARS Coronavirus 2: NEGATIVE

## 2021-05-09 ENCOUNTER — Encounter (HOSPITAL_BASED_OUTPATIENT_CLINIC_OR_DEPARTMENT_OTHER): Payer: Self-pay | Admitting: *Deleted

## 2021-05-09 ENCOUNTER — Emergency Department (HOSPITAL_BASED_OUTPATIENT_CLINIC_OR_DEPARTMENT_OTHER): Payer: BC Managed Care – PPO

## 2021-05-09 ENCOUNTER — Inpatient Hospital Stay (HOSPITAL_BASED_OUTPATIENT_CLINIC_OR_DEPARTMENT_OTHER)
Admission: EM | Admit: 2021-05-09 | Discharge: 2021-05-11 | DRG: 644 | Disposition: A | Discharge status: Home / Self Care | Payer: BC Managed Care – PPO | Attending: Internal Medicine | Admitting: Internal Medicine

## 2021-05-09 ENCOUNTER — Other Ambulatory Visit: Payer: Self-pay

## 2021-05-09 DIAGNOSIS — N2889 Other specified disorders of kidney and ureter: Secondary | ICD-10-CM

## 2021-05-09 DIAGNOSIS — E669 Obesity, unspecified: Secondary | ICD-10-CM | POA: Diagnosis present

## 2021-05-09 DIAGNOSIS — E785 Hyperlipidemia, unspecified: Secondary | ICD-10-CM | POA: Diagnosis not present

## 2021-05-09 DIAGNOSIS — R519 Headache, unspecified: Secondary | ICD-10-CM | POA: Diagnosis not present

## 2021-05-09 DIAGNOSIS — Z6833 Body mass index (BMI) 33.0-33.9, adult: Secondary | ICD-10-CM

## 2021-05-09 DIAGNOSIS — D49512 Neoplasm of unspecified behavior of left kidney: Secondary | ICD-10-CM | POA: Diagnosis not present

## 2021-05-09 DIAGNOSIS — Z905 Acquired absence of kidney: Secondary | ICD-10-CM | POA: Diagnosis not present

## 2021-05-09 DIAGNOSIS — Z79899 Other long term (current) drug therapy: Secondary | ICD-10-CM | POA: Diagnosis not present

## 2021-05-09 DIAGNOSIS — E876 Hypokalemia: Secondary | ICD-10-CM | POA: Diagnosis not present

## 2021-05-09 DIAGNOSIS — Z7951 Long term (current) use of inhaled steroids: Secondary | ICD-10-CM

## 2021-05-09 DIAGNOSIS — D201 Benign neoplasm of soft tissue of peritoneum: Secondary | ICD-10-CM | POA: Diagnosis not present

## 2021-05-09 DIAGNOSIS — N99842 Postprocedural seroma of a genitourinary system organ or structure following a genitourinary system procedure: Secondary | ICD-10-CM | POA: Diagnosis not present

## 2021-05-09 DIAGNOSIS — Z20822 Contact with and (suspected) exposure to covid-19: Secondary | ICD-10-CM | POA: Diagnosis not present

## 2021-05-09 DIAGNOSIS — Z833 Family history of diabetes mellitus: Secondary | ICD-10-CM | POA: Diagnosis not present

## 2021-05-09 DIAGNOSIS — T502X5A Adverse effect of carbonic-anhydrase inhibitors, benzothiadiazides and other diuretics, initial encounter: Secondary | ICD-10-CM | POA: Diagnosis not present

## 2021-05-09 DIAGNOSIS — I10 Essential (primary) hypertension: Secondary | ICD-10-CM | POA: Diagnosis not present

## 2021-05-09 DIAGNOSIS — E222 Syndrome of inappropriate secretion of antidiuretic hormone: Secondary | ICD-10-CM | POA: Diagnosis not present

## 2021-05-09 DIAGNOSIS — G4733 Obstructive sleep apnea (adult) (pediatric): Secondary | ICD-10-CM | POA: Diagnosis not present

## 2021-05-09 DIAGNOSIS — Y838 Other surgical procedures as the cause of abnormal reaction of the patient, or of later complication, without mention of misadventure at the time of the procedure: Secondary | ICD-10-CM | POA: Diagnosis not present

## 2021-05-09 DIAGNOSIS — E7800 Pure hypercholesterolemia, unspecified: Secondary | ICD-10-CM | POA: Diagnosis not present

## 2021-05-09 DIAGNOSIS — E860 Dehydration: Secondary | ICD-10-CM | POA: Diagnosis not present

## 2021-05-09 DIAGNOSIS — J45909 Unspecified asthma, uncomplicated: Secondary | ICD-10-CM | POA: Diagnosis not present

## 2021-05-09 DIAGNOSIS — E871 Hypo-osmolality and hyponatremia: Secondary | ICD-10-CM | POA: Diagnosis present

## 2021-05-09 DIAGNOSIS — N179 Acute kidney failure, unspecified: Secondary | ICD-10-CM | POA: Diagnosis not present

## 2021-05-09 LAB — BASIC METABOLIC PANEL
Anion gap: 10 (ref 5–15)
Anion gap: 11 (ref 5–15)
Anion gap: 8 (ref 5–15)
BUN: 10 mg/dL (ref 6–20)
BUN: 11 mg/dL (ref 6–20)
BUN: 9 mg/dL (ref 6–20)
CO2: 21 mmol/L — ABNORMAL LOW (ref 22–32)
CO2: 23 mmol/L (ref 22–32)
CO2: 24 mmol/L (ref 22–32)
Calcium: 6.8 mg/dL — ABNORMAL LOW (ref 8.9–10.3)
Calcium: 7.8 mg/dL — ABNORMAL LOW (ref 8.9–10.3)
Calcium: 8.7 mg/dL — ABNORMAL LOW (ref 8.9–10.3)
Chloride: 84 mmol/L — ABNORMAL LOW (ref 98–111)
Chloride: 85 mmol/L — ABNORMAL LOW (ref 98–111)
Chloride: 92 mmol/L — ABNORMAL LOW (ref 98–111)
Creatinine, Ser: 1.1 mg/dL (ref 0.61–1.24)
Creatinine, Ser: 1.29 mg/dL — ABNORMAL HIGH (ref 0.61–1.24)
Creatinine, Ser: 1.29 mg/dL — ABNORMAL HIGH (ref 0.61–1.24)
GFR, Estimated: >60 mL/min (ref >60)
GFR, Estimated: >60 mL/min (ref >60)
GFR, Estimated: >60 mL/min (ref >60)
Glucose, Bld: 76 mg/dL (ref 70–99)
Glucose, Bld: 81 mg/dL (ref 70–99)
Glucose, Bld: 90 mg/dL (ref 70–99)
Potassium: 2.5 mmol/L — CL (ref 3.5–5.1)
Potassium: 2.8 mmol/L — ABNORMAL LOW (ref 3.5–5.1)
Potassium: 3.1 mmol/L — ABNORMAL LOW (ref 3.5–5.1)
Sodium: 117 mmol/L — CL (ref 135–145)
Sodium: 120 mmol/L — ABNORMAL LOW (ref 135–145)
Sodium: 121 mmol/L — ABNORMAL LOW (ref 135–145)

## 2021-05-09 LAB — COMPREHENSIVE METABOLIC PANEL
ALT: 23 U/L (ref 0–44)
AST: 20 U/L (ref 15–41)
Albumin: 4.1 g/dL (ref 3.5–5.0)
Alkaline Phosphatase: 76 U/L (ref 38–126)
Anion gap: 13 (ref 5–15)
BUN: 11 mg/dL (ref 6–20)
CO2: 26 mmol/L (ref 22–32)
Calcium: 9.2 mg/dL (ref 8.9–10.3)
Chloride: 77 mmol/L — ABNORMAL LOW (ref 98–111)
Creatinine, Ser: 1.42 mg/dL — ABNORMAL HIGH (ref 0.61–1.24)
GFR, Estimated: >60 mL/min (ref >60)
Glucose, Bld: 94 mg/dL (ref 70–99)
Potassium: 3.4 mmol/L — ABNORMAL LOW (ref 3.5–5.1)
Sodium: 116 mmol/L — CL (ref 135–145)
Total Bilirubin: 0.6 mg/dL (ref 0.3–1.2)
Total Protein: 8.4 g/dL — ABNORMAL HIGH (ref 6.5–8.1)

## 2021-05-09 LAB — RESP PANEL BY RT-PCR (FLU A&B, COVID) ARPGX2
Influenza A by PCR: NEGATIVE
Influenza B by PCR: NEGATIVE
SARS Coronavirus 2 by RT PCR: NEGATIVE

## 2021-05-09 LAB — URINALYSIS, ROUTINE W REFLEX MICROSCOPIC
Bilirubin Urine: NEGATIVE
Glucose, UA: NEGATIVE mg/dL
Hgb urine dipstick: NEGATIVE
Ketones, ur: NEGATIVE mg/dL
Leukocytes,Ua: NEGATIVE
Nitrite: NEGATIVE
Protein, ur: NEGATIVE mg/dL
Specific Gravity, Urine: 1.015 (ref 1.005–1.030)
pH: 7 (ref 5.0–8.0)

## 2021-05-09 LAB — CBC WITH DIFFERENTIAL/PLATELET
Abs Immature Granulocytes: 0.03 10*3/uL (ref 0.00–0.07)
Basophils Absolute: 0.1 10*3/uL (ref 0.0–0.1)
Basophils Relative: 1 %
Eosinophils Absolute: 1.1 10*3/uL — ABNORMAL HIGH (ref 0.0–0.5)
Eosinophils Relative: 15 %
HCT: 38.5 % — ABNORMAL LOW (ref 39.0–52.0)
Hemoglobin: 14.1 g/dL (ref 13.0–17.0)
Immature Granulocytes: 0 %
Lymphocytes Relative: 17 %
Lymphs Abs: 1.3 10*3/uL (ref 0.7–4.0)
MCH: 30.6 pg (ref 26.0–34.0)
MCHC: 36.6 g/dL — ABNORMAL HIGH (ref 30.0–36.0)
MCV: 83.5 fL (ref 80.0–100.0)
Monocytes Absolute: 0.9 10*3/uL (ref 0.1–1.0)
Monocytes Relative: 11 %
Neutro Abs: 4.3 10*3/uL (ref 1.7–7.7)
Neutrophils Relative %: 56 %
Platelets: 339 10*3/uL (ref 150–400)
RBC: 4.61 MIL/uL (ref 4.22–5.81)
RDW: 12 % (ref 11.5–15.5)
WBC: 7.7 10*3/uL (ref 4.0–10.5)
nRBC: 0 % (ref 0.0–0.2)

## 2021-05-09 LAB — OSMOLALITY: Osmolality: 243 mOsm/kg — CL (ref 275–295)

## 2021-05-09 LAB — OSMOLALITY, URINE: Osmolality, Ur: 291 mOsm/kg — ABNORMAL LOW (ref 300–900)

## 2021-05-09 MED ORDER — SODIUM CHLORIDE 0.9 % IV BOLUS
1000.0000 mL | Freq: Once | INTRAVENOUS | Status: AC
  Administered 2021-05-09: 1000 mL via INTRAVENOUS

## 2021-05-09 MED ORDER — SODIUM CHLORIDE 0.9 % IV SOLN: Freq: Once | INTRAVENOUS | Status: AC

## 2021-05-09 MED ORDER — POTASSIUM CHLORIDE 10 MEQ/100ML IV SOLN
10.0000 meq | INTRAVENOUS | Status: DC
  Administered 2021-05-10: 10 meq via INTRAVENOUS
  Filled 2021-05-09: qty 100

## 2021-05-09 NOTE — ED Notes (Signed)
ED Provider at bedside. Yao MD 

## 2021-05-09 NOTE — ED Provider Notes (Signed)
[facility] EMERGENCY DEPARTMENT Provider Note   CSN: [id number] Arrival date & time: 05/09/21  1154     History No chief complaint on file.   Robert Garrett is a 50 y.o. male hx of HTN, HL, here with weakness, nausea, headache.  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  Patient states that over the last week or so, he has been having some nausea and headaches.  He states that he has poor appetite.  Denies any vomiting and is still passing gas and had normal bowel movement today.  Patient also has diffuse weakness so came here for further evaluation.  The history is provided by the patient.      Past Medical History:  Diagnosis Date   Hypercholesteremia    Hypertension     Patient Active Problem List   Diagnosis Date Noted   ANXIETY 12/09/2007   SINUSITIS, ACUTE 12/09/2007   ALLERGIC RHINITIS 12/09/2007   VERTIGO, HX OF 12/09/2007    Past Surgical History:  Procedure Laterality Date   KIDNEY CYST REMOVAL Left    removal of left kidney Left    sinus surgery         No family history on file.  Social History   Tobacco Use   Smoking status: Never   Smokeless tobacco: Never  Substance Use Topics   Alcohol use: Yes    Comment: rarely   Drug use: No    Home Medications Prior to Admission medications   Medication Sig Start Date End Date Taking? Authorizing Provider  albuterol (VENTOLIN HFA) 108 (90 Base) MCG/ACT inhaler Inhale into the lungs. 02/26/20  Yes [provider]  ADVAIR HFA 45-21 MCG/ACT inhaler Inhale into the lungs. 04/19/21   [provider]  dicyclomine (BENTYL) 20 MG tablet Take 1 tablet (20 mg total) by mouth 3 (three) times daily as needed for spasms. 10/20/20   Davonna Belling, MD  fluticasone (FLONASE) 50 MCG/ACT nasal spray Place 1 spray into both nostrils 2 (two) times daily. 04/20/21   [provider]   losartan-hydrochlorothiazide (HYZAAR) 50-12.5 MG tablet Take 1 tablet by mouth daily.    [provider]  meclizine (ANTIVERT) 25 MG tablet Take 1 tablet (25 mg total) by mouth 3 (three) times daily as needed for dizziness. 05/09/17   Fredia Sorrow, MD  ondansetron (ZOFRAN-ODT) 4 MG disintegrating tablet Take 1 tablet (4 mg total) by mouth every 8 (eight) hours as needed for nausea or vomiting. 10/20/20   Davonna Belling, MD  pantoprazole (PROTONIX) 40 MG tablet Take 40 mg by mouth 2 (two) times daily. 12/23/20   [provider]  potassium chloride (KLOR-CON) 10 MEQ tablet Take 1 tablet (10 mEq total) by mouth 2 (two) times daily. 10/20/20   Davonna Belling, MD  rosuvastatin (CRESTOR) 10 MG tablet Take 10 mg by mouth at bedtime. 04/20/21   [provider]    Allergies    Patient has no known allergies.  Review of Systems   Review of Systems  Neurological:  Positive for weakness.  All other systems reviewed and are negative.  Physical Exam Updated Vital Signs BP (!) 137/102   Pulse 78   Temp 98.1 F (36.7 C) (Oral)   Resp 18   Ht 5\' 7"  (1.702 m)   Wt 93.9 kg   SpO2 100%   BMI 32.42 kg/m   Physical Exam Vitals and nursing note reviewed.  Constitutional:  Comments: Dehydrated   HENT:     Head: Normocephalic.     Nose: Nose normal.     Mouth/Throat:     Mouth: Mucous membranes are dry.  Eyes:     Extraocular Movements: Extraocular movements intact.     Pupils: Pupils are equal, round, and reactive to light.  Cardiovascular:     Rate and Rhythm: Normal rate and regular rhythm.     Pulses: Normal pulses.     Heart sounds: Normal heart sounds.  Pulmonary:     Effort: Pulmonary effort is normal.     Breath sounds: Normal breath sounds.  Abdominal:     General: Abdomen is flat.     Palpations: Abdomen is soft.     Comments: Large vertical midline incision that is healing well.  No obvious abdominal tenderness or distention  Musculoskeletal:         General: Normal range of motion.     Cervical back: Normal range of motion and neck supple.  Skin:    General: Skin is warm.     Capillary Refill: Capillary refill takes less than 2 seconds.  Neurological:     General: No focal deficit present.     Mental Status: He is alert.  Psychiatric:        Mood and Affect: Mood normal.        Behavior: Behavior normal.    ED Results / Procedures / Treatments   Labs (all labs ordered are listed, but only abnormal results are displayed) Labs Reviewed  CBC WITH DIFFERENTIAL/PLATELET - Abnormal; Notable for the following components:      Result Value   HCT 38.5 (*)    MCHC 36.6 (*)    Eosinophils Absolute 1.1 (*)    All other components within normal limits  COMPREHENSIVE METABOLIC PANEL - Abnormal; Notable for the following components:   Sodium 116 (*)    Potassium 3.4 (*)    Chloride 77 (*)    Creatinine, Ser 1.42 (*)    Total Protein 8.4 (*)    All other components within normal limits  URINALYSIS, ROUTINE W REFLEX MICROSCOPIC - Abnormal; Notable for the following components:   Color, Urine STRAW (*)    All other components within normal limits  RESP PANEL BY RT-PCR (FLU A&B, COVID) [id number]  OSMOLALITY  OSMOLALITY, URINE    EKG None  Radiology No results found.  Procedures Procedures   CRITICAL CARE Performed by: Wandra Arthurs   Total critical care time: 30 minutes  Critical care time was exclusive of separately billable procedures and treating other patients.  Critical care was necessary to treat or prevent imminent or life-threatening deterioration.  Critical care was time spent personally by me on the following activities: development of treatment plan with patient and/or surrogate as well as nursing, discussions with consultants, evaluation of patient's response to treatment, examination of patient, obtaining history from patient or surrogate, ordering and performing treatments and interventions, ordering and  review of laboratory studies, ordering and review of radiographic studies, pulse oximetry and re-evaluation of patient's condition.   Medications Ordered in ED Medications  sodium chloride 0.9 % bolus 1,000 mL (has no administration in time range)    ED Course  I have reviewed the triage vital signs and the nursing notes.  Pertinent labs & imaging results that were available during my care of the patient were reviewed by me and considered in my medical decision making (see chart for details).    MDM Rules/Calculators/A&P  BURNS TIMSON is a 50 y.o. male who presenting with nausea and headache and weakness.  Consider small bowel obstruction versus electrolyte abnormalities versus dehydration.  Plan to get CBC and CMP and CT abdomen pelvis.  5:24 PM CT showed fluid collection about 5 cm in diameter around the L nephrectomy site. He has no fever or vomiting and no SBO on CT. I doubt abscess and likely postop seroma. Sodium is 116 likely from dehydration. CT head showed no brain mass.  I talked to [facility] transfer center.  Discussed with Dr. Waldemar Dickens. He request power share images and will talk to Dr. Carlis Abbott, his attending   6:58 PM Dr. Carlis Abbott called back. 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  Since there is no obvious postsurgical complication, he does not feel strongly that patient has to be transferred to [facility] does not have any beds right now.  Patient also request to go to [facility] and I also talked to transfer center regarding [place] and it was no beds either.  At this point, will admit patient to the hospitalist service at [facility].  Patient received normal saline bolus of 1 L and will be started on 150 cc/hr. We will check sodium levels every 6 hours to make sure we didn't over correct the sodium.   Final Clinical Impression(s) / ED Diagnoses Final diagnoses:  None     Rx / DC Orders ED Discharge Orders     None        Drenda Freeze, MD 05/09/21 1900

## 2021-05-09 NOTE — H&P (Signed)
[name]:[id number] DOB: Nov 04, 1970 DOA: 05/09/2021     PCP: [name], MD   Outpatient Specialists:   Surgeon Dr. Carlis Abbott   Patient arrived to ER on 05/09/21 at 1154 Referred by Attending Rise Patience, MD   Patient coming from: home Lives  With family    Chief Complaint: Headache fatigue   HPI: NIL Robert Garrett is a 50 y.o. male with medical history significant of    Malignant neoplasm of retroperitoneum   Benign neoplasm of left kidney   Retroperitoneal sarcoma  HTN, HLD  Presented with headache nausea vomiting no appetite decreased p.o. intake.  For the past 1 week. Able to pass gas normal bowel movement.   Recently was diagnosed with retroperitoneal mass and had left nephrectomy and retroperitoneal mass removal at [facility] on September 7.   Since Friday he had very hard time keeping any food down He has been drinking fluids Family has been trying to minimize salt and increased fluids like water   Reports his sodium has been 133 -132 ever since he has been on Hyzaar    left retroperitoneal mass abuting the vasculature of the left kidney. Biopsy indicates a smooth muscle neoplasm including leiomyoma, sarcoma or anigomyolipoma.   Feels tired and weak no pain  Still slightly nauseous   Has  been vaccinated against COVID  and boosted   Initial COVID TEST  NEGATIVE   Lab Results  Component Value Date   [place] NEGATIVE 05/09/2021   [place] NEGATIVE 10/20/2020  Regarding pertinent Chronic problems:     Hyperlipidemia - on statins Crestor Lipid Panel     HTN on Hyzaar    obesity-   BMI Readings from Last 1 Encounters:  05/09/21 33.04 kg/m      Asthma -well   controlled on home inhalers Advair     OSA - noncompliant with CPAP    While in ER: CT showed fluid collection about 5 cm in diameter around the L nephrectomy site.  Discussed with [facility] who felt no evidence of complication.  Likely seroma Patient received normal  saline bolus of 1 L and will be started on 150 cc/hr. We will check sodium levels every 6 hours to make sure we didn't over correct the sodium.    CT head showed no brain mass.   ED Triage Vitals  Enc Vitals Group     BP 05/09/21 1209 (!) 136/106     Pulse Rate 05/09/21 1209 98     Resp 05/09/21 1209 20     Temp 05/09/21 1209 98.1 F (36.7 C)     Temp Source 05/09/21 1209 Oral     SpO2 05/09/21 1209 99 %     Weight 05/09/21 1208 207 lb (93.9 kg)     Height 05/09/21 1208 5\' 7"  (1.702 m)     Head Circumference --      Peak Flow --      Pain Score 05/09/21 1208 6     Pain Loc --      Pain Edu? --      Excl. in [place]? --   TMAX(24)@     _________________________________________ Significant initial  Findings: Abnormal Labs Reviewed  CBC WITH DIFFERENTIAL/PLATELET - Abnormal; Notable for the following components:      Result Value   HCT 38.5 (*)    MCHC 36.6 (*)    Eosinophils Absolute 1.1 (*)    All other components within normal limits  COMPREHENSIVE METABOLIC PANEL -  Abnormal; Notable for the following components:   Sodium 116 (*)    Potassium 3.4 (*)    Chloride 77 (*)    Creatinine, Ser 1.42 (*)    Total Protein 8.4 (*)    All other components within normal limits  URINALYSIS, ROUTINE W REFLEX MICROSCOPIC - Abnormal; Notable for the following components:   Color, Urine STRAW (*)    All other components within normal limits  OSMOLALITY - Abnormal; Notable for the following components:   Osmolality 243 (*)    All other components within normal limits  OSMOLALITY, URINE - Abnormal; Notable for the following components:   Osmolality, Ur 291 (*)    All other components within normal limits  BASIC METABOLIC PANEL - Abnormal; Notable for the following components:   Sodium 121 (*)    Potassium 2.5 (*)    Chloride 92 (*)    CO2 21 (*)    Calcium 6.8 (*)    All other components within normal limits  BASIC METABOLIC PANEL - Abnormal; Notable for the following components:    Sodium 117 (*)    Potassium 2.8 (*)    Chloride 84 (*)    Creatinine, Ser 1.29 (*)    Calcium 7.8 (*)    All other components within normal limits   ____________________________________________ Ordered   Chest x-ray possible right basilar lung nodule but otherwise nonacute  CT head nonacute CTabd/pelvis - Status post recent left nephrectomy. There is fluid pocket in the inferior surgical bed measuring 5.6 x 3.6 x 6.5 cm. 2. Minimal fluid tracking along left pericolic gutter.     ECG: Ordered Personally reviewed by me showing: HR : 86 Rhythm: NSR,    no evidence of ischemic changes QTC 504 _________  The recent clinical data is shown below. Vitals:   05/09/21 1830 05/09/21 1900 05/09/21 1927 05/09/21 2206  BP: (!) 139/99 (!) 132/93 (!) 143/101 (!) 144/100  Pulse: 81 72 75 73  Resp: 18 18 16 20   Temp:   98 F (36.7 C) 98 F (36.7 C)  TempSrc:   Oral Oral  SpO2: 99% 99% 98% 100%  Weight:    95.7 kg  Height:       WBC    Component Value Date/Time   WBC 7.7 05/09/2021 1415   LYMPHSABS 1.3 05/09/2021 1415   MONOABS 0.9 05/09/2021 1415   EOSABS 1.1 (H) 05/09/2021 1415   BASOSABS 0.1 05/09/2021 1415       UA  no evidence of UTI      Urine analysis:    Component Value Date/Time   COLORURINE STRAW (A) 05/09/2021 1415   APPEARANCEUR CLEAR 05/09/2021 1415   LABSPEC 1.015 05/09/2021 1415   PHURINE 7.0 05/09/2021 1415   GLUCOSEU NEGATIVE 05/09/2021 1415   HGBUR NEGATIVE 05/09/2021 1415   BILIRUBINUR NEGATIVE 05/09/2021 1415   KETONESUR NEGATIVE 05/09/2021 1415   PROTEINUR NEGATIVE 05/09/2021 1415   NITRITE NEGATIVE 05/09/2021 1415   LEUKOCYTESUR NEGATIVE 05/09/2021 1415    Results for orders placed or performed during the hospital encounter of 05/09/21  Resp Panel by RT-PCR (Flu A&B, Covid) Nasopharyngeal Swab     Status: None   Collection Time: 05/09/21  4:21 PM   Specimen: Nasopharyngeal Swab; Nasopharyngeal(NP) swabs in vial transport medium  Result Value  Ref Range Status   SARS Coronavirus 2 by RT PCR NEGATIVE NEGATIVE Final         Influenza A by PCR NEGATIVE NEGATIVE Final   Influenza B by PCR NEGATIVE  NEGATIVE Final           _______________________________________________ Hospitalist was called for admission for hyponatremia  The following Work up has been ordered so far:  Orders Placed This Encounter  Procedures   Resp Panel by RT-PCR (Flu A&B, Covid) Nasopharyngeal Swab   CT Renal Stone Study   CT HEAD WO CONTRAST (5MM)   DG Chest 2 View   CBC with Differential   Comprehensive metabolic panel   Urinalysis, Routine w reflex microscopic   Osmolality   Osmolality, urine   Basic metabolic panel   Basic metabolic panel   Cardiac monitoring   Consult to hospitalist   Place in observation (patient's expected length of stay will be less than 2 midnights)     Following Medications were ordered in ER: Medications  sodium chloride 0.9 % bolus 1,000 mL (0 mLs Intravenous Stopped 05/09/21 1805)  0.9 %  sodium chloride infusion ( Intravenous New Bag/Given 05/09/21 1926)        Consult Orders  (From admission, onward)           Start     Ordered   05/09/21 1850  Consult to hospitalist  Called [facility] spoke with Marden Noble  Once       Provider:  (Not yet assigned)  Question Answer Comment  Place call to: [facility]   Reason for Consult Admit      05/09/21 1849            OTHER Significant initial  Findings:  labs showing:    Recent Labs  Lab 05/09/21 1415 05/09/21 1920 05/09/21 2046 05/09/21 2340  NA 116* 121* 117* 120*  K 3.4* 2.5* 2.8* 3.1*  CO2 26 21* 23 24  GLUCOSE 94 76 81 90  BUN 11 9 10 11   CREATININE 1.42* 1.10 1.29* 1.29*  CALCIUM 9.2 6.8* 7.8* 8.7*  MG  --   --   --  2.2  PHOS  --   --   --  3.2    Cr   Up from baseline see below Lab Results  Component Value Date   CREATININE 1.29 (H) 05/09/2021   CREATININE 1.29 (H) 05/09/2021   CREATININE 1.10 05/09/2021    Recent Labs  Lab  05/09/21 1415  AST 20  ALT 23  ALKPHOS 76  BILITOT 0.6  PROT 8.4*  ALBUMIN 4.1   Lab Results  Component Value Date   CALCIUM 7.8 (L) 05/09/2021          Plt: Lab Results  Component Value Date   PLT 339 05/09/2021       COVID-19 Labs  No results for input(s): DDIMER, FERRITIN, LDH, CRP in the last 72 hours.  Lab Results  Component Value Date   SARSCOV2NAA NEGATIVE 05/09/2021   [place] NEGATIVE 10/20/2020        Recent Labs  Lab 05/09/21 1415  WBC 7.7  NEUTROABS 4.3  HGB 14.1  HCT 38.5*  MCV 83.5  PLT 339    HG/HCT   stable,      Component Value Date/Time   HGB 14.1 05/09/2021 1415   HCT 38.5 (L) 05/09/2021 1415   MCV 83.5 05/09/2021 1415     Cardiac Panel (last 3 results) Recent Labs    05/09/21 2340  CKTOTAL 57      Cultures: No results found for: SDES, SPECREQUEST, CULT, REPTSTATUS   Radiological Exams on Admission: DG Chest 2 View  Result Date: 05/09/2021 CLINICAL DATA:  History of recent nephrectomy with weakness,  fatigue and headache. EXAM: CHEST - 2 VIEW COMPARISON:  None. FINDINGS: Decreased lung volumes are seen with mild areas of bibasilar atelectasis. An ill-defined 1.0 cm diameter round opacity is seen overlying the lateral aspect of the right lung base. This is not seen on the prior study. There is no evidence of a pleural effusion or pneumothorax. The heart size and mediastinal contours are within normal limits. The visualized skeletal structures are unremarkable. IMPRESSION: 1. Mild bibasilar atelectasis. 2. Findings which may represent a right basilar lung nodule. Correlation with nonemergent chest CT is recommended. Electronically Signed   By: Virgina Norfolk M.D.   On: 05/09/2021 17:17   CT HEAD WO CONTRAST (5MM)  Result Date: 05/09/2021 CLINICAL DATA:  History of left nephrectomy on April 26, 2021 with weakness since surgery. Presents with frontal headache. EXAM: CT HEAD WITHOUT CONTRAST TECHNIQUE: Contiguous axial images  were obtained from the base of the skull through the vertex without intravenous contrast. COMPARISON:  May 09, 2017 FINDINGS: Brain: No evidence of acute infarction, hemorrhage, hydrocephalus, extra-axial collection or mass lesion/mass effect. Vascular: No hyperdense vessel or unexpected calcification. Skull: Normal. Negative for fracture or focal lesion. Sinuses/Orbits: Marked severity bilateral maxillary sinus, bilateral ethmoid sinus, sphenoid sinus and frontal sinus mucosal thickening is seen. Other: None. IMPRESSION: 1. No acute intracranial abnormality. 2. Marked severity paranasal sinus disease. Electronically Signed   By: Virgina Norfolk M.D.   On: 05/09/2021 17:13   CT Renal Stone Study  Result Date: 05/09/2021 CLINICAL DATA:  Kidney stone and flank pain.  Recent nephrectomy. EXAM: CT ABDOMEN AND PELVIS WITHOUT CONTRAST TECHNIQUE: Multidetector CT imaging of the abdomen and pelvis was performed following the standard protocol without IV contrast. COMPARISON:  CT enterography 01/24/2021. FINDINGS: Lower chest: There is atelectasis in the lung bases. Hepatobiliary: No focal liver abnormality is seen. No gallstones, gallbladder wall thickening, or biliary dilatation. Pancreas: Unremarkable. No pancreatic ductal dilatation or surrounding inflammatory changes. Spleen: Normal in size without focal abnormality. Adrenals/Urinary Tract: Bilateral adrenal glands are within normal limits. The right kidney and ureter are within normal limits. There is stable nonspecific mild right perinephric fat stranding. No renal or ureteral calculi are seen. The bladder is within normal limits. Patient is status post left nephrectomy. There is fluid and stranding in the nephrectomy bed posteriorly and inferiorly. Fluid pocket inferiorly is largest and measures 5.0 x 3.6 x 6.5 cm. No air within the collection. Collection measures simple fluid. Stomach/Bowel: Stomach is within normal limits. Appendix appears normal. No  evidence of bowel wall thickening, distention, or inflammatory changes. There are scattered colonic diverticula. There is no evidence for acute diverticulitis. Vascular/Lymphatic: No significant vascular findings are present. No enlarged abdominal or pelvic lymph nodes. Reproductive: Prostate is unremarkable. Other: There is a small amount of fluid in stranding tracking along the left retroperitoneum and left pericolic gutter. There is no free intraperitoneal air. No air-fluid levels are identified. There is some subcutaneous stranding in the anterior abdominal wall likely related to recent surgery. There are no abdominal wall fluid collections. Musculoskeletal: No acute or significant osseous findings. IMPRESSION: 1. Status post recent left nephrectomy. There is fluid pocket in the inferior surgical bed measuring 5.6 x 3.6 x 6.5 cm. 2. Minimal fluid tracking along left pericolic gutter. Electronically Signed   By: Ronney Asters M.D.   On: 05/09/2021 17:03   _______________________________________________________________________________________________________ Latest  Blood pressure (!) 144/100, pulse 73, temperature 98 F (36.7 C), temperature source Oral, resp. rate 20, height 5\' 7"  (1.702 m),  weight 95.7 kg, SpO2 100 %.   Review of Systems:    Pertinent positives include:  fatigue, nausea,  Constitutional:  No weight loss, night sweats, Fevers, chills, weight loss  HEENT:  No headaches, Difficulty swallowing,Tooth/dental problems,Sore throat,  No sneezing, itching, ear ache, nasal congestion, post nasal drip,  Cardio-vascular:  No chest pain, Orthopnea, PND, anasarca, dizziness, palpitations.no Bilateral lower extremity swelling  GI:  No heartburn, indigestion, abdominal pain,  vomiting, diarrhea, change in bowel habits, loss of appetite, melena, blood in stool, hematemesis Resp:  no shortness of breath at rest. No dyspnea on exertion, No excess mucus, no productive cough, No non-productive  cough, No coughing up of blood.No change in color of mucus.No wheezing. Skin:  no rash or lesions. No jaundice GU:  no dysuria, change in color of urine, no urgency or frequency. No straining to urinate.  No flank pain.  Musculoskeletal:  No joint pain or no joint swelling. No decreased range of motion. No back pain.  Psych:  No change in mood or affect. No depression or anxiety. No memory loss.  Neuro: no localizing neurological complaints, no tingling, no weakness, no double vision, no gait abnormality, no slurred speech, no confusion  All systems reviewed and apart from [place] all are negative _______________________________________________________________________________________________ Past Medical History:   Past Medical History:  Diagnosis Date   Hypercholesteremia    Hypertension      Past Surgical History:  Procedure Laterality Date   KIDNEY CYST REMOVAL Left    removal of left kidney Left    sinus surgery      Social History:  Ambulatory  independently      reports that he has never smoked. He has never used smokeless tobacco. He reports current alcohol use. He reports that he does not use drugs.   Family History:   Family History  Problem Relation Age of Onset   Diabetes Mother    ______________________________________________________________________________________________ Allergies: No Known Allergies   Prior to Admission medications   Medication Sig Start Date End Date Taking? Authorizing Provider  albuterol (VENTOLIN HFA) 108 (90 Base) MCG/ACT inhaler Inhale into the lungs. 02/26/20  Yes [provider]  ADVAIR HFA 45-21 MCG/ACT inhaler Inhale into the lungs. 04/19/21   [provider]  dicyclomine (BENTYL) 20 MG tablet Take 1 tablet (20 mg total) by mouth 3 (three) times daily as needed for spasms. 10/20/20   Davonna Belling, MD  fluticasone (FLONASE) 50 MCG/ACT nasal spray Place 1 spray into both nostrils 2 (two) times daily. 04/20/21    [provider]  losartan-hydrochlorothiazide (HYZAAR) 50-12.5 MG tablet Take 1 tablet by mouth daily.    [provider]  meclizine (ANTIVERT) 25 MG tablet Take 1 tablet (25 mg total) by mouth 3 (three) times daily as needed for dizziness. 05/09/17   Fredia Sorrow, MD  ondansetron (ZOFRAN-ODT) 4 MG disintegrating tablet Take 1 tablet (4 mg total) by mouth every 8 (eight) hours as needed for nausea or vomiting. 10/20/20   Davonna Belling, MD  pantoprazole (PROTONIX) 40 MG tablet Take 40 mg by mouth 2 (two) times daily. 12/23/20   [provider]  potassium chloride (KLOR-CON) 10 MEQ tablet Take 1 tablet (10 mEq total) by mouth 2 (two) times daily. 10/20/20   Davonna Belling, MD  rosuvastatin (CRESTOR) 10 MG tablet Take 10 mg by mouth at bedtime. 04/20/21   [provider]    ___________________________________________________________________________________________________ Physical Exam: Vitals with BMI 05/09/2021 05/09/2021 05/09/2021  Height - - -  Weight  211 lbs - -  BMI 30.07 - -  Systolic 622 633 354  Diastolic 562 563 93  Pulse 73 75 72     1. General:  in No  Acute distress    Chronically ill -appearing 2. Psychological: Alert and  Oriented 3. Head/ENT:   Dry Mucous Membranes                          Head Non traumatic, neck supple                           Poor Dentition 4. SKIN:  decreased Skin turgor,  Skin clean Dry and intact no rash multiple lipomas wide spread 5. Heart: Regular rate and rhythm no  Murmur, no Rub or gallop 6. Lungs:  , no wheezes or crackles   7. Abdomen: Soft, appropriately tender, Non distended   obese  bowel sounds present 8. Lower extremities: no clubbing, cyanosis, no  edema 9. Neurologically Grossly intact, moving all 4 extremities equally   10. MSK: Normal range of motion    Chart has been  reviewed  ______________________________________________________________________________________________  Assessment/Plan 50 y.o. male with medical history significant of    Malignant neoplasm of retroperitoneum   Benign neoplasm of left kidney   Retroperitoneal sarcoma  HTN, HLD    Admitted for hyponatemia   Present on Admission:  Hyponatremia discussed with nephrology worrisome for SIADH.  Recommend stopping IV fluids and start fluid restriction.  Continue to follow sodium nephrology will see in AM.  Obtain urine electrolytes.  Check TSH And cortisol level   Hypokalemia replace and check magnesium level   Essential hypertension -hold Hyzaar would avoid hydrochlorothiazide. May need titration of different blood pressure medications at time of discharge. For tonight allow permissive hypertension   Hyperlipidemia -stable resume home meds  History of sleep apnea currently not on CPAP   Renal mass-patient has follow-[facility] in the setting of recent nephrectomy.  Continue to monitor as expected reduced GFR  Other plan as per orders.  DVT prophylaxis:   Lovenox      Code Status:    Code Status: Not on file FULL CODE  as per patient  I had personally discussed CODE STATUS with patient      Family Communication:   Family   at  Bedside  plan of care was discussed   with  Wife   Disposition Plan:    To home once workup is complete and patient is stable   Following barriers for discharge:                            Electrolytes corrected                               Will need consultants to evaluate patient prior to discharge                                         Consults called: nephrology Dr. Joylene Grapes  Admission status:  ED Disposition     ED Disposition  [place]: [facility] [[id number]]  Level of Care: Telemetry [5]  Admit  to tele based on following criteria: Monitor QTC interval   Interfacility transfer: Yes  May place patient in observation at [facility] or [place] if equivalent level of care is available:: No  Covid Evaluation: Asymptomatic Screening Protocol (No Symptoms)  Diagnosis: Hyponatremia [945859]  Admitting Physician: Rise Patience [phone]  Attending Physician: Rise Patience [phone]           Obs   Level of care    tele  For 12H     Lab Results  Component Value Date   [place] 05/09/2021     Precautions: admitted as  Covid Negative   PPE: Used by the provider:   N95  eye Goggles,  Gloves     Daphna Lafuente 05/10/2021, 12:56 AM    [facility]     after 2 AM please page floor coverage PA If 7AM-7PM, please contact the day team taking care of the patient using [email or web address]   Patient was evaluated in the context of the global COVID-19 pandemic, which necessitated consideration that the patient might be at risk for infection with the SARS-CoV-2 virus that causes COVID-19. Institutional protocols and algorithms that pertain to the evaluation of patients at risk for COVID-19 are in a state of rapid change based on information released by regulatory bodies including the CDC and federal and state organizations. These policies and algorithms were followed during the patient's care.

## 2021-05-09 NOTE — ED Notes (Signed)
Patient transported to CT 

## 2021-05-09 NOTE — ED Triage Notes (Signed)
Kidney removal recently. No appetite, weakness, nausea.

## 2021-05-10 ENCOUNTER — Encounter (HOSPITAL_COMMUNITY): Payer: Self-pay | Admitting: Internal Medicine

## 2021-05-10 DIAGNOSIS — E860 Dehydration: Secondary | ICD-10-CM | POA: Diagnosis present

## 2021-05-10 DIAGNOSIS — T502X5A Adverse effect of carbonic-anhydrase inhibitors, benzothiadiazides and other diuretics, initial encounter: Secondary | ICD-10-CM | POA: Diagnosis present

## 2021-05-10 DIAGNOSIS — E871 Hypo-osmolality and hyponatremia: Secondary | ICD-10-CM | POA: Diagnosis present

## 2021-05-10 DIAGNOSIS — E7800 Pure hypercholesterolemia, unspecified: Secondary | ICD-10-CM | POA: Diagnosis present

## 2021-05-10 DIAGNOSIS — D201 Benign neoplasm of soft tissue of peritoneum: Secondary | ICD-10-CM | POA: Diagnosis present

## 2021-05-10 DIAGNOSIS — Z20822 Contact with and (suspected) exposure to covid-19: Secondary | ICD-10-CM | POA: Diagnosis present

## 2021-05-10 DIAGNOSIS — E222 Syndrome of inappropriate secretion of antidiuretic hormone: Secondary | ICD-10-CM | POA: Diagnosis present

## 2021-05-10 DIAGNOSIS — N2889 Other specified disorders of kidney and ureter: Secondary | ICD-10-CM | POA: Diagnosis present

## 2021-05-10 DIAGNOSIS — E876 Hypokalemia: Secondary | ICD-10-CM | POA: Diagnosis present

## 2021-05-10 DIAGNOSIS — D49512 Neoplasm of unspecified behavior of left kidney: Secondary | ICD-10-CM | POA: Diagnosis present

## 2021-05-10 DIAGNOSIS — E785 Hyperlipidemia, unspecified: Secondary | ICD-10-CM | POA: Diagnosis present

## 2021-05-10 DIAGNOSIS — Z6833 Body mass index (BMI) 33.0-33.9, adult: Secondary | ICD-10-CM | POA: Diagnosis not present

## 2021-05-10 DIAGNOSIS — R519 Headache, unspecified: Secondary | ICD-10-CM | POA: Diagnosis present

## 2021-05-10 DIAGNOSIS — J45909 Unspecified asthma, uncomplicated: Secondary | ICD-10-CM | POA: Diagnosis present

## 2021-05-10 DIAGNOSIS — Z833 Family history of diabetes mellitus: Secondary | ICD-10-CM | POA: Diagnosis not present

## 2021-05-10 DIAGNOSIS — I10 Essential (primary) hypertension: Secondary | ICD-10-CM | POA: Diagnosis present

## 2021-05-10 DIAGNOSIS — Z905 Acquired absence of kidney: Secondary | ICD-10-CM | POA: Diagnosis not present

## 2021-05-10 DIAGNOSIS — E669 Obesity, unspecified: Secondary | ICD-10-CM | POA: Diagnosis present

## 2021-05-10 DIAGNOSIS — Y838 Other surgical procedures as the cause of abnormal reaction of the patient, or of later complication, without mention of misadventure at the time of the procedure: Secondary | ICD-10-CM | POA: Diagnosis present

## 2021-05-10 DIAGNOSIS — Z7951 Long term (current) use of inhaled steroids: Secondary | ICD-10-CM | POA: Diagnosis not present

## 2021-05-10 DIAGNOSIS — Z79899 Other long term (current) drug therapy: Secondary | ICD-10-CM | POA: Diagnosis not present

## 2021-05-10 DIAGNOSIS — N179 Acute kidney failure, unspecified: Secondary | ICD-10-CM | POA: Diagnosis present

## 2021-05-10 DIAGNOSIS — G4733 Obstructive sleep apnea (adult) (pediatric): Secondary | ICD-10-CM | POA: Diagnosis present

## 2021-05-10 DIAGNOSIS — N99842 Postprocedural seroma of a genitourinary system organ or structure following a genitourinary system procedure: Secondary | ICD-10-CM | POA: Diagnosis present

## 2021-05-10 LAB — BASIC METABOLIC PANEL
Anion gap: 12 (ref 5–15)
Anion gap: 9 (ref 5–15)
BUN: 11 mg/dL (ref 6–20)
BUN: 11 mg/dL (ref 6–20)
CO2: 24 mmol/L (ref 22–32)
CO2: 25 mmol/L (ref 22–32)
Calcium: 8.8 mg/dL — ABNORMAL LOW (ref 8.9–10.3)
Calcium: 9 mg/dL (ref 8.9–10.3)
Chloride: 85 mmol/L — ABNORMAL LOW (ref 98–111)
Chloride: 89 mmol/L — ABNORMAL LOW (ref 98–111)
Creatinine, Ser: 1.31 mg/dL — ABNORMAL HIGH (ref 0.61–1.24)
Creatinine, Ser: 1.08 mg/dL (ref 0.61–1.24)
GFR, Estimated: >60 mL/min (ref >60)
GFR, Estimated: >60 mL/min (ref >60)
Glucose, Bld: 85 mg/dL (ref 70–99)
Glucose, Bld: 98 mg/dL (ref 70–99)
Potassium: 3.5 mmol/L (ref 3.5–5.1)
Potassium: 3.5 mmol/L (ref 3.5–5.1)
Sodium: 121 mmol/L — ABNORMAL LOW (ref 135–145)
Sodium: 123 mmol/L — ABNORMAL LOW (ref 135–145)

## 2021-05-10 LAB — CBC WITH DIFFERENTIAL/PLATELET
Abs Immature Granulocytes: 0.02 10*3/uL (ref 0.00–0.07)
Basophils Absolute: 0.1 10*3/uL (ref 0.0–0.1)
Basophils Relative: 1 %
Eosinophils Absolute: 1.3 10*3/uL — ABNORMAL HIGH (ref 0.0–0.5)
Eosinophils Relative: 17 %
HCT: 35.4 % — ABNORMAL LOW (ref 39.0–52.0)
Hemoglobin: 12.9 g/dL — ABNORMAL LOW (ref 13.0–17.0)
Immature Granulocytes: 0 %
Lymphocytes Relative: 15 %
Lymphs Abs: 1.2 10*3/uL (ref 0.7–4.0)
MCH: 30.6 pg (ref 26.0–34.0)
MCHC: 36.4 g/dL — ABNORMAL HIGH (ref 30.0–36.0)
MCV: 84.1 fL (ref 80.0–100.0)
Monocytes Absolute: 0.7 10*3/uL (ref 0.1–1.0)
Monocytes Relative: 9 %
Neutro Abs: 4.6 10*3/uL (ref 1.7–7.7)
Neutrophils Relative %: 58 %
Platelets: 294 10*3/uL (ref 150–400)
RBC: 4.21 MIL/uL — ABNORMAL LOW (ref 4.22–5.81)
RDW: 12 % (ref 11.5–15.5)
WBC: 7.8 10*3/uL (ref 4.0–10.5)
nRBC: 0 % (ref 0.0–0.2)

## 2021-05-10 LAB — PHOSPHORUS
Phosphorus: 3.2 mg/dL (ref 2.5–4.6)
Phosphorus: 3.3 mg/dL (ref 2.5–4.6)

## 2021-05-10 LAB — COMPREHENSIVE METABOLIC PANEL
ALT: 22 U/L (ref 0–44)
AST: 18 U/L (ref 15–41)
Albumin: 3.6 g/dL (ref 3.5–5.0)
Alkaline Phosphatase: 70 U/L (ref 38–126)
Anion gap: 11 (ref 5–15)
BUN: 11 mg/dL (ref 6–20)
CO2: 25 mmol/L (ref 22–32)
Calcium: 8.9 mg/dL (ref 8.9–10.3)
Chloride: 85 mmol/L — ABNORMAL LOW (ref 98–111)
Creatinine, Ser: 1.39 mg/dL — ABNORMAL HIGH (ref 0.61–1.24)
GFR, Estimated: >60 mL/min (ref >60)
Glucose, Bld: 84 mg/dL (ref 70–99)
Potassium: 3.3 mmol/L — ABNORMAL LOW (ref 3.5–5.1)
Sodium: 121 mmol/L — ABNORMAL LOW (ref 135–145)
Total Bilirubin: 0.7 mg/dL (ref 0.3–1.2)
Total Protein: 7.4 g/dL (ref 6.5–8.1)

## 2021-05-10 LAB — HEPATIC FUNCTION PANEL
ALT: 22 U/L (ref 0–44)
AST: 19 U/L (ref 15–41)
Albumin: 3.7 g/dL (ref 3.5–5.0)
Alkaline Phosphatase: 71 U/L (ref 38–126)
Bilirubin, Direct: 0.2 mg/dL (ref 0.0–0.2)
Indirect Bilirubin: 0.4 mg/dL (ref 0.3–0.9)
Total Bilirubin: 0.6 mg/dL (ref 0.3–1.2)
Total Protein: 7.5 g/dL (ref 6.5–8.1)

## 2021-05-10 LAB — TSH: TSH: 2.322 u[IU]/mL (ref 0.350–4.500)

## 2021-05-10 LAB — CORTISOL-AM, BLOOD: Cortisol - AM: 7.6 ug/dL (ref 6.7–22.6)

## 2021-05-10 LAB — MAGNESIUM
Magnesium: 2.1 mg/dL (ref 1.7–2.4)
Magnesium: 2.2 mg/dL (ref 1.7–2.4)
Magnesium: 2.2 mg/dL (ref 1.7–2.4)

## 2021-05-10 LAB — CK: Total CK: 57 U/L (ref 49–397)

## 2021-05-10 LAB — SODIUM, URINE, RANDOM: Sodium, Ur: 50 mmol/L

## 2021-05-10 LAB — SODIUM: Sodium: 125 mmol/L — ABNORMAL LOW (ref 135–145)

## 2021-05-10 LAB — HIV ANTIBODY (ROUTINE TESTING W REFLEX): HIV Screen 4th Generation wRfx: NONREACTIVE

## 2021-05-10 LAB — CREATININE, URINE, RANDOM: Creatinine, Urine: 39.1 mg/dL

## 2021-05-10 MED ORDER — ENOXAPARIN SODIUM 40 MG/0.4ML IJ SOSY
40.0000 mg | PREFILLED_SYRINGE | INTRAMUSCULAR | Status: DC
  Administered 2021-05-10 – 2021-05-11 (×2): 40 mg via SUBCUTANEOUS
  Filled 2021-05-10 (×2): qty 0.4

## 2021-05-10 MED ORDER — POTASSIUM CHLORIDE CRYS ER 20 MEQ PO TBCR
40.0000 meq | EXTENDED_RELEASE_TABLET | Freq: Once | ORAL | Status: AC
  Administered 2021-05-10: 40 meq via ORAL
  Filled 2021-05-10: qty 2

## 2021-05-10 MED ORDER — AMLODIPINE BESYLATE 10 MG PO TABS
10.0000 mg | ORAL_TABLET | Freq: Every day | ORAL | Status: DC
  Administered 2021-05-10 – 2021-05-11 (×2): 10 mg via ORAL
  Filled 2021-05-10: qty 1

## 2021-05-10 MED ORDER — SODIUM CHLORIDE 0.9 % IV SOLN: 250.0000 mL | INTRAVENOUS | Status: DC | PRN

## 2021-05-10 MED ORDER — SODIUM CHLORIDE 0.9% FLUSH: 3.0000 mL | INTRAVENOUS | Status: DC | PRN

## 2021-05-10 MED ORDER — CALCIUM ACETATE (PHOS BINDER) 667 MG PO CAPS: 2668.0000 mg | ORAL_CAPSULE | Freq: Three times a day (TID) | ORAL | Status: DC

## 2021-05-10 MED ORDER — ROSUVASTATIN CALCIUM 5 MG PO TABS
5.0000 mg | ORAL_TABLET | Freq: Every day | ORAL | Status: DC
  Administered 2021-05-10: 5 mg via ORAL
  Filled 2021-05-10: qty 1

## 2021-05-10 MED ORDER — MELATONIN 3 MG PO TABS
3.0000 mg | ORAL_TABLET | Freq: Every day | ORAL | Status: DC
  Administered 2021-05-10: 3 mg via ORAL
  Filled 2021-05-10: qty 1

## 2021-05-10 MED ORDER — MOMETASONE FURO-FORMOTEROL FUM 100-5 MCG/ACT IN AERO
2.0000 | INHALATION_SPRAY | Freq: Two times a day (BID) | RESPIRATORY_TRACT | Status: DC
  Administered 2021-05-10 – 2021-05-11 (×3): 2 via RESPIRATORY_TRACT
  Filled 2021-05-10: qty 8.8

## 2021-05-10 MED ORDER — ACETAMINOPHEN 325 MG PO TABS: 650.0000 mg | ORAL_TABLET | Freq: Four times a day (QID) | ORAL | Status: DC | PRN

## 2021-05-10 MED ORDER — ACETAMINOPHEN 650 MG RE SUPP: 650.0000 mg | Freq: Four times a day (QID) | RECTAL | Status: DC | PRN

## 2021-05-10 MED ORDER — ROSUVASTATIN CALCIUM 10 MG PO TABS
10.0000 mg | ORAL_TABLET | Freq: Every day | ORAL | Status: DC
  Administered 2021-05-10: 10 mg via ORAL
  Filled 2021-05-10: qty 1

## 2021-05-10 MED ORDER — SODIUM CHLORIDE 0.9% FLUSH
3.0000 mL | Freq: Two times a day (BID) | INTRAVENOUS | Status: DC
  Administered 2021-05-10 – 2021-05-11 (×3): 3 mL via INTRAVENOUS

## 2021-05-10 MED ORDER — PANTOPRAZOLE SODIUM 40 MG PO TBEC
40.0000 mg | DELAYED_RELEASE_TABLET | Freq: Two times a day (BID) | ORAL | Status: DC
  Administered 2021-05-10 – 2021-05-11 (×4): 40 mg via ORAL
  Filled 2021-05-10 (×4): qty 1

## 2021-05-10 MED ORDER — TOLVAPTAN 15 MG PO TABS
15.0000 mg | ORAL_TABLET | Freq: Once | ORAL | Status: AC
  Administered 2021-05-10: 15 mg via ORAL
  Filled 2021-05-10: qty 1

## 2021-05-10 MED ORDER — ONDANSETRON HCL 4 MG/2ML IJ SOLN: 4.0000 mg | Freq: Four times a day (QID) | INTRAMUSCULAR | Status: DC | PRN

## 2021-05-10 MED ORDER — HYDROCODONE-ACETAMINOPHEN 5-325 MG PO TABS: 1.0000 | ORAL_TABLET | ORAL | Status: DC | PRN

## 2021-05-10 MED ORDER — POTASSIUM CHLORIDE IN NACL 40-0.9 MEQ/L-% IV SOLN
INTRAVENOUS | Status: DC
  Filled 2021-05-10: qty 1000

## 2021-05-10 NOTE — Progress Notes (Signed)
Medication guide on [place] given to pt.

## 2021-05-10 NOTE — Consult Note (Signed)
Renal Service Consult Note [facility]  Robert Garrett 05/10/2021 Robert Blazing, MD Requesting Physician: Dr Starla Link  Reason for Consult: Hyponatremia HPI: The patient is a 50 y.o. year-old w/ hx of HTN and HL presented to ED w/ HA and N/V, no appetite for 1 week.  Has been drinking fluids, not keeping food down. No fevers. Pt reported low Na 132 range since he was put on Hyzaar. In ED Na was 117.  Uosm 250 range, UNa wnl. Pt admitted and Hyzaar put on hold. Pt rec'd NS IV and serum Na+ improved to 120-121 range but is not improving now. Asked to see for hyponatremia.   Pt states he had L kidney removed due to a RP mass abutting the renal vasculature. 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   Pt is feeling better today, no N/V or abd pain.      ROS - denies CP, no joint pain, no HA, no blurry vision, no rash, no diarrhea, no nausea/ vomiting, no dysuria, no difficulty voiding   Past Medical History  Past Medical History:  Diagnosis Date   Hypercholesteremia    Hypertension    Past Surgical History  Past Surgical History:  Procedure Laterality Date   KIDNEY CYST REMOVAL Left    removal of left kidney Left    sinus surgery     Family History  Family History  Problem Relation Age of Onset   Diabetes Mother    Social History  reports that he has never smoked. He has never used smokeless tobacco. He reports current alcohol use. He reports that he does not use drugs. Allergies No Known Allergies Home medications Prior to Admission medications   Medication Sig Start Date End Date Taking? Authorizing Provider  ADVAIR HFA [phone] MCG/ACT inhaler Inhale 2 puffs into the lungs 2 (two) times daily. 04/19/21  Yes [provider]  albuterol (VENTOLIN HFA) 108 (90 Base) MCG/ACT inhaler Inhale 1-2 puffs into the lungs every 4 (four) hours as needed for shortness of breath or wheezing. 02/26/20  Yes [provider]  fluticasone (FLONASE) 50 MCG/ACT nasal spray Place 1 spray into both nostrils 2 (two) times daily. 04/20/21  Yes [provider]  losartan-hydrochlorothiazide (HYZAAR) 50-12.5 MG tablet Take 1 tablet by mouth daily.   Yes [provider]  oxyCODONE (OXY IR/ROXICODONE) 5 MG immediate release tablet Take 5 mg by mouth every 6 (six) hours as needed. 04/30/21  Yes [provider]  pantoprazole (PROTONIX) 40 MG tablet Take 40 mg by mouth 2 (two) times daily. 12/23/20  Yes [provider]  rosuvastatin (CRESTOR) 10 MG tablet Take 10 mg by mouth at bedtime. 04/20/21  Yes [provider]  dicyclomine (BENTYL) 20 MG tablet Take 1 tablet (20 mg total) by mouth 3 (three) times daily as needed for spasms. Patient not taking: Reported on 05/10/2021 10/20/20   Davonna Belling, MD  meclizine (ANTIVERT) 25 MG tablet Take 1 tablet (25 mg total) by mouth 3 (three) times daily as needed for dizziness. Patient not taking: Reported on 05/10/2021 05/09/17   Fredia Sorrow, MD  ondansetron (ZOFRAN-ODT) 4 MG disintegrating tablet Take 1 tablet (4 mg total) by mouth every 8 (eight) hours as needed for nausea or vomiting. Patient not taking: Reported on 05/10/2021 10/20/20   Davonna Belling, MD  potassium chloride (KLOR-CON) 10 MEQ tablet Take 1 tablet (10 mEq total) by mouth 2 (two) times daily. Patient not taking:  Reported on 05/10/2021 10/20/20   Davonna Belling, MD     Vitals:   05/09/21 2206 05/10/21 0136 05/10/21 0326 05/10/21 0813  BP: (!) 144/100 (!) 131/91 119/86   Pulse: 73 83 93 (!) 101  Resp: 20 20 18 18   Temp: 98 F (36.7 C) 98.6 F (37 C) 97.7 F (36.5 C)   TempSrc: Oral Oral Axillary   SpO2: 100% 97% 97% 96%  Weight: 95.7 kg     Height:       Exam Gen alert, no distress No rash, cyanosis or gangrene Sclera anicteric, throat clear  No jvd or bruits Chest clear bilat to bases, no rales/ wheezing RRR no MRG Abd soft ntnd no mass or ascites +bs GU  normal MS no joint effusions or deformity Ext no LE or UE edema, no wounds or ulcers Neuro is alert, Ox 3 , nf         Date   Creat  eGFR    2012   1.00    2018   0.99  > 60    March 2022  0.99    Sept 20, 2022 1.42  > 60    Sept 21, 2022 1.31  > 60   Home meds include advair, ventolin, losartan-hctz, ppi, crestor, klor-con, prns    UNa 50, UCr 39   Uosm 291   UA negative   CXR - IMPRESSION: Mild bibasilar atelectasis.possilbe lung nodule   CT abd - Urinary Tract: The right kidney and ureter are within normal limits. There is stable nonspecific mild right perinephric fat stranding. No renal or ureteral calculi are seen. The bladder is within normal limits. Patient is status post left nephrectomy. There is fluid and stranding in the nephrectomy bed posteriorly and inferiorly. Fluid pocket inferiorly is largest and measures 5.0 x 3.6 x 6.5 cm.   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  HCTZ and ARB on hold, agree. Responded to NS infusion but Na stalled in low 120's today. Will give tolvaptan x 1 today. Cont to hold ARB for 1-2 wks until this episode is over.  HTN - starting norvasc 10 qd given ARB on hold and hctz now contraindicated.  SP L nephrectomy - recent surgery on 9/08 due to RP mass. Mass turned out to be benign. Creat bump from March to this admit is expected. eGFR still > 60 ml/min.       Kelly Splinter  MD 05/10/2021, 11:10 AM  Recent Labs  Lab 05/09/21 1415 05/10/21 0146  WBC 7.7 7.8  HGB 14.1 12.9*   Recent Labs  Lab 05/09/21 2340 05/10/21 0146 05/10/21 0657  K 3.1* 3.3* 3.5  BUN 11 11 11   CREATININE 1.29* 1.39* 1.31*  CALCIUM 8.7* 8.9 8.8*  PHOS 3.2 3.3  --

## 2021-05-10 NOTE — Progress Notes (Addendum)
Patient ID: Robert Garrett, male   DOB: 30-Jul-1971, 50 y.o.   MRN: [id number]  PROGRESS NOTE    Robert Garrett  SJG:[id number] DOB: May 07, 1971 DOA: 05/09/2021 PCP: [name], MD   Brief Narrative:  50 year old male with history of hypertension, hyperlipidemia, recent left nephrectomy and removal of retroperitoneal mass on 04/26/2021 at [facility] with pathology consistent with leiomyoma presented with headache and fatigue along with poor oral intake and intermittent nausea/vomiting.  On presentation, his sodium was 116.  CT of the abdomen showed fluid collection around the left nephrectomy site.  ED provider discussed CT findings with surgeon at [facility] who thought that this was most likely seroma and no need for surgical intervention or transfer to [facility]  Nephrology was consulted.  Assessment & Plan:   Acute hyponatremia -Possibly from poor oral intake and continued diuretic use (patient is on Hyzaar at home) -Presented with sodium of 116.  Treated with IV fluids.  Sodium 121 this morning.  I will start normal saline at 100 cc an hour with supplemental IV potassium until nephrology revaluation -Continue to monitor sodium levels.  Recent left nephrectomy and removal of retroperitoneal mass on 04/26/2021 -Pathology consistent for leiomyoma. -CT on admission showed fluid collection around surgical site. -ED provider discussed CT findings with surgeon at [facility] who thought that this was most likely seroma and no need for surgical intervention or transfer to [facility].   -Outpatient follow-up with general surgery  Hypertension -Monitor blood pressure.  Hyzaar on hold.  Hyperlipidemia -Continue rosuvastatin  Hypokalemia -Continue replacement.  Monitor  AKI -In the setting of recent nephrectomy.  Creatinine currently stable.  Monitor.  Follow nephrology recommendations.    DVT prophylaxis:  Lovenox Code Status: Full Family Communication: Wife at bedside Disposition Plan: Status is: Inpatient  Remains inpatient appropriate because:Inpatient level of care appropriate due to severity of illness  Dispo: The patient is from: Home              Anticipated d/c is to: Home              Patient currently is not medically stable to d/c.   Difficult to place patient No  Consultants: Nephrology  Procedures: None  Antimicrobials: None   Subjective: Patient seen and examined at bedside.  Denies any current nausea, worsening abdominal pain, vomiting, shortness of breath or fever.  Objective: Vitals:   05/10/21 0326 05/10/21 0813 05/10/21 1300 05/10/21 1340  BP: 119/86   (!) 147/105  Pulse: 93 (!) 101    Resp: 18 18  19   Temp: 97.7 F (36.5 C)   98.2 F (36.8 C)  TempSrc: Axillary   Oral  SpO2: 97% 96%  97%  Weight:   97 kg   Height:        Intake/Output Summary (Last 24 hours) at 05/10/2021 1405 Last data filed at 05/10/2021 1355 Gross per 24 hour  Intake 1591 ml  Output --  Net 1591 ml   Filed Weights   05/09/21 1208 05/09/21 2206 05/10/21 1300  Weight: 93.9 kg 95.7 kg 97 kg    Examination:  General exam: Appears calm and comfortable.  Currently on room air Respiratory system: Bilateral decreased breath sounds at bases Cardiovascular system: S1 & S2 heard, Rate controlled Gastrointestinal system: Abdomen is slightly distended, soft and nontender.  Midline surgical incision is healing well.  Normal bowel sounds heard. Extremities: No cyanosis, clubbing, edema  Central  nervous system: Alert and oriented. No focal neurological deficits. Moving extremities Skin: No other rashes, lesions or ulcers Psychiatry: Judgement and insight appear normal. Mood & affect appropriate.     Data Reviewed: I have personally reviewed following labs and imaging studies  CBC: Recent Labs  Lab 05/09/21 1415 05/10/21 0146  WBC 7.7 7.8  NEUTROABS 4.3 4.6  HGB 14.1 12.9*   HCT 38.5* 35.4*  MCV 83.5 84.1  PLT 339 735   Basic Metabolic Panel: Recent Labs  Lab 05/09/21 2046 05/09/21 2340 05/10/21 0146 05/10/21 0657 05/10/21 1248 05/10/21 1255  NA 117* 120* 121* 121*  --  123*  K 2.8* 3.1* 3.3* 3.5  --  3.5  CL 84* 85* 85* 85*  --  89*  CO2 23 24 25 24   --  25  GLUCOSE 81 90 84 85  --  98  BUN 10 11 11 11   --  11  CREATININE 1.29* 1.29* 1.39* 1.31*  --  1.08  CALCIUM 7.8* 8.7* 8.9 8.8*  --  9.0  MG  --  2.2 2.1  --  2.2  --   PHOS  --  3.2 3.3  --   --   --    GFR: Estimated Creatinine Clearance: 90.9 mL/min (by C-G formula based on SCr of 1.08 mg/dL). Liver Function Tests: Recent Labs  Lab 05/09/21 1415 05/10/21 0146 05/10/21 1255  AST 20 18 19   ALT 23 22 22   ALKPHOS 76 70 71  BILITOT 0.6 0.7 0.6  PROT 8.4* 7.4 7.5  ALBUMIN 4.1 3.6 3.7   No results for input(s): LIPASE, AMYLASE in the last 168 hours. No results for input(s): AMMONIA in the last 168 hours. Coagulation Profile: No results for input(s): INR, PROTIME in the last 168 hours. Cardiac Enzymes: Recent Labs  Lab 05/09/21 2340  CKTOTAL 57   BNP (last 3 results) No results for input(s): PROBNP in the last 8760 hours. HbA1C: No results for input(s): HGBA1C in the last 72 hours. CBG: No results for input(s): GLUCAP in the last 168 hours. Lipid Profile: No results for input(s): CHOL, HDL, LDLCALC, TRIG, CHOLHDL, LDLDIRECT in the last 72 hours. Thyroid Function Tests: Recent Labs    05/10/21 0146  TSH 2.322   Anemia Panel: No results for input(s): VITAMINB12, FOLATE, FERRITIN, TIBC, IRON, RETICCTPCT in the last 72 hours. Sepsis Labs: No results for input(s): PROCALCITON, LATICACIDVEN in the last 168 hours.  Recent Results (from the past 240 hour(s))  Resp Panel by RT-PCR (Flu A&B, Covid) Nasopharyngeal Swab     Status: None   Collection Time: 05/09/21  4:21 PM   Specimen: Nasopharyngeal Swab; Nasopharyngeal(NP) swabs in vial transport medium  Result Value Ref  Range Status   SARS Coronavirus 2 by RT PCR NEGATIVE NEGATIVE Final    Comment: (NOTE) SARS-CoV-2 target nucleic acids are NOT DETECTED.  The SARS-CoV-2 RNA is generally detectable in upper respiratory specimens during the acute phase of infection. The lowest concentration of SARS-CoV-2 viral copies this assay can detect is 138 copies/mL. A negative result does not preclude SARS-Cov-2 infection and should not be used as the sole basis for treatment or other patient management decisions. A negative result may occur with  improper specimen collection/handling, submission of specimen other than nasopharyngeal swab, presence of viral mutation(s) within the areas targeted by this assay, and inadequate number of viral copies(<138 copies/mL). A negative result must be combined with clinical observations, patient history, and epidemiological information. The expected result is Negative.  Fact  Sheet for Patients:  EntrepreneurPulse.com.au  Fact Sheet for Healthcare Providers:  IncredibleEmployment.be  This test is no t yet approved or cleared by the [place] FDA and  has been authorized for detection and/or diagnosis of SARS-CoV-2 by FDA under an Emergency Use Authorization (EUA). This EUA will remain  in effect (meaning this test can be used) for the duration of the COVID-19 declaration under Section 564(b)(1) of the Act, 21 U.S.C.section 360bbb-3(b)(1), unless the authorization is terminated  or revoked sooner.       Influenza A by PCR NEGATIVE NEGATIVE Final   Influenza B by PCR NEGATIVE NEGATIVE Final    Comment: (NOTE) The Xpert Xpress SARS-CoV-2/FLU/RSV plus assay is intended as an aid in the diagnosis of influenza from Nasopharyngeal swab specimens and should not be used as a sole basis for treatment. Nasal washings and aspirates are unacceptable for Xpert Xpress SARS-CoV-2/FLU/RSV testing.  Fact Sheet for  Patients: EntrepreneurPulse.com.au  Fact Sheet for Healthcare Providers: IncredibleEmployment.be  This test is not yet approved or cleared by the [place] FDA and has been authorized for detection and/or diagnosis of SARS-CoV-2 by FDA under an Emergency Use Authorization (EUA). This EUA will remain in effect (meaning this test can be used) for the duration of the COVID-19 declaration under Section 564(b)(1) of the Act, 21 U.S.C. section 360bbb-3(b)(1), unless the authorization is terminated or revoked.  Performed at [facility], [address], [place], [place] [address]          Radiology Studies: DG Chest 2 View  Result Date: 05/09/2021 CLINICAL DATA:  History of recent nephrectomy with weakness, fatigue and headache. EXAM: CHEST - 2 VIEW COMPARISON:  None. FINDINGS: Decreased lung volumes are seen with mild areas of bibasilar atelectasis. An ill-defined 1.0 cm diameter round opacity is seen overlying the lateral aspect of the right lung base. This is not seen on the prior study. There is no evidence of a pleural effusion or pneumothorax. The heart size and mediastinal contours are within normal limits. The visualized skeletal structures are unremarkable. IMPRESSION: 1. Mild bibasilar atelectasis. 2. Findings which may represent a right basilar lung nodule. Correlation with nonemergent chest CT is recommended. Electronically Signed   By: Virgina Norfolk M.D.   On: 05/09/2021 17:17   CT HEAD WO CONTRAST (5MM)  Result Date: 05/09/2021 CLINICAL DATA:  History of left nephrectomy on April 26, 2021 with weakness since surgery. Presents with frontal headache. EXAM: CT HEAD WITHOUT CONTRAST TECHNIQUE: Contiguous axial images were obtained from the base of the skull through the vertex without intravenous contrast. COMPARISON:  May 09, 2017 FINDINGS: Brain: No evidence of acute infarction, hemorrhage, hydrocephalus, extra-axial  collection or mass lesion/mass effect. Vascular: No hyperdense vessel or unexpected calcification. Skull: Normal. Negative for fracture or focal lesion. Sinuses/Orbits: Marked severity bilateral maxillary sinus, bilateral ethmoid sinus, sphenoid sinus and frontal sinus mucosal thickening is seen. Other: None. IMPRESSION: 1. No acute intracranial abnormality. 2. Marked severity paranasal sinus disease. Electronically Signed   By: Virgina Norfolk M.D.   On: 05/09/2021 17:13   CT Renal Stone Study  Result Date: 05/09/2021 CLINICAL DATA:  Kidney stone and flank pain.  Recent nephrectomy. EXAM: CT ABDOMEN AND PELVIS WITHOUT CONTRAST TECHNIQUE: Multidetector CT imaging of the abdomen and pelvis was performed following the standard protocol without IV contrast. COMPARISON:  CT enterography 01/24/2021. FINDINGS: Lower chest: There is atelectasis in the lung bases. Hepatobiliary: No focal liver abnormality is seen. No gallstones, gallbladder wall thickening, or biliary  dilatation. Pancreas: Unremarkable. No pancreatic ductal dilatation or surrounding inflammatory changes. Spleen: Normal in size without focal abnormality. Adrenals/Urinary Tract: Bilateral adrenal glands are within normal limits. The right kidney and ureter are within normal limits. There is stable nonspecific mild right perinephric fat stranding. No renal or ureteral calculi are seen. The bladder is within normal limits. Patient is status post left nephrectomy. There is fluid and stranding in the nephrectomy bed posteriorly and inferiorly. Fluid pocket inferiorly is largest and measures 5.0 x 3.6 x 6.5 cm. No air within the collection. Collection measures simple fluid. Stomach/Bowel: Stomach is within normal limits. Appendix appears normal. No evidence of bowel wall thickening, distention, or inflammatory changes. There are scattered colonic diverticula. There is no evidence for acute diverticulitis. Vascular/Lymphatic: No significant vascular findings  are present. No enlarged abdominal or pelvic lymph nodes. Reproductive: Prostate is unremarkable. Other: There is a small amount of fluid in stranding tracking along the left retroperitoneum and left pericolic gutter. There is no free intraperitoneal air. No air-fluid levels are identified. There is some subcutaneous stranding in the anterior abdominal wall likely related to recent surgery. There are no abdominal wall fluid collections. Musculoskeletal: No acute or significant osseous findings. IMPRESSION: 1. Status post recent left nephrectomy. There is fluid pocket in the inferior surgical bed measuring 5.6 x 3.6 x 6.5 cm. 2. Minimal fluid tracking along left pericolic gutter. Electronically Signed   By: Ronney Asters M.D.   On: 05/09/2021 17:03        Scheduled Meds:  amLODipine  10 mg Oral Daily   enoxaparin (LOVENOX) injection  40 mg Subcutaneous Q24H   mometasone-formoterol  2 puff Inhalation BID   pantoprazole  40 mg Oral BID   rosuvastatin  5 mg Oral QHS   sodium chloride flush  3 mL Intravenous Q12H   Continuous Infusions:  sodium chloride            Aline August, MD [facility] 05/10/2021, 2:05 PM

## 2021-05-11 LAB — BASIC METABOLIC PANEL
Anion gap: 8 (ref 5–15)
BUN: 12 mg/dL (ref 6–20)
CO2: 25 mmol/L (ref 22–32)
Calcium: 9.4 mg/dL (ref 8.9–10.3)
Chloride: 99 mmol/L (ref 98–111)
Creatinine, Ser: 1.27 mg/dL — ABNORMAL HIGH (ref 0.61–1.24)
GFR, Estimated: >60 mL/min (ref >60)
Glucose, Bld: 94 mg/dL (ref 70–99)
Potassium: 3.8 mmol/L (ref 3.5–5.1)
Sodium: 132 mmol/L — ABNORMAL LOW (ref 135–145)

## 2021-05-11 LAB — MAGNESIUM: Magnesium: 2.5 mg/dL — ABNORMAL HIGH (ref 1.7–2.4)

## 2021-05-11 MED ORDER — CARVEDILOL 12.5 MG PO TABS: 12.5000 mg | ORAL_TABLET | Freq: Two times a day (BID) | ORAL | 3 refills | Status: DC

## 2021-05-11 MED ORDER — ROSUVASTATIN CALCIUM 10 MG PO TABS: 5.0000 mg | ORAL_TABLET | Freq: Every day | ORAL | Status: AC

## 2021-05-11 MED ORDER — CARVEDILOL 12.5 MG PO TABS
12.5000 mg | ORAL_TABLET | Freq: Two times a day (BID) | ORAL | Status: DC
  Administered 2021-05-11: 12.5 mg via ORAL
  Filled 2021-05-11: qty 1

## 2021-05-11 MED ORDER — AMLODIPINE BESYLATE 10 MG PO TABS: 10.0000 mg | ORAL_TABLET | Freq: Every day | ORAL | 3 refills | Status: AC

## 2021-05-11 NOTE — Progress Notes (Signed)
[facility] Progress Note  Subjective: doing well, Na 132 this am  Vitals:   05/10/21 1517 05/10/21 2048 05/11/21 0552 05/11/21 0754  BP: (!) 144/101 (!) 135/93 (!) 140/98   Pulse: (!) 101 (!) 107 99   Resp: 20 20 18   Temp: 98.8 F (37.1 C) 98.1 F (36.7 C) 98.6 F (37 C)   TempSrc: Oral  Oral   SpO2: 98% 97% 100% 99%  Weight:      Height:        Exam:  alert, nad   no jvd  Chest cta bilat  Cor reg no RG  Abd soft ntnd no ascites   Ext no LE edema   Alert, NF, ox3          Date                          Creat               eGFR    2012                         1.00    2018                         0.99                 > 60    March 2022              0.99    Sept 20, 2022          1.42                 > 60    Sept 21, 2022          1.31                 > 60    Home meds include advair, ventolin, losartan-hctz, ppi, crestor, klor-con, prns     UNa 50, UCr 39   Uosm 291   UA negative   CXR - IMPRESSION: Mild bibasilar atelectasis.possilbe lung nodule   CT abd - Urinary Tract: The right kidney and ureter are within normal limits. There is stable nonspecific mild right perinephric fat stranding. No renal or ureteral calculi are seen. The bladder is within normal limits. Patient is status post left nephrectomy. There is fluid and stranding in the nephrectomy bed posteriorly and inferiorly. Fluid pocket inferiorly is largest and measures 5.0 x 3.6 x 6.5 cm.     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 Home HCTZ and ARB were held. With NS 0.9% Na improved 117 > 121. With tolvaptan x 1, Na improved to 132 today. Would avoid HCTZ/ thiazides for this patient. Will hold ARB 1-2 mos given recent nephrectomy. Okay for dc, has appt scheduled for next week at [facility]. Questions answered.  HTN - started norvasc 10 qd, also coreg 12.5 bid. HCTZ/ losartan dc'd.  SP L  nephrectomy - recent surgery on 9/08 due to RP mass. Mass turned out to be benign. Creat bump from March to this admit (1.0 > 1.3 range) is expected. eGFR remains > 60 ml/min.            Robert Garrett 05/11/2021, 12:25 PM   Recent Labs  Lab 05/09/21 1415 05/09/21 1920 05/09/21   2340 05/10/21 0146 05/10/21 0657 05/10/21 1255 05/11/21 0515  K 3.4*   < > 3.1* 3.3*   < > 3.5 3.8  BUN 11   < > 11 11   < > 11 12  CREATININE 1.42*   < > 1.29* 1.39*   < > 1.08 1.27*  CALCIUM 9.2   < > 8.7* 8.9   < > 9.0 9.4  PHOS  --   --  3.2 3.3  --   --   --   HGB 14.1  --   --  12.9*  --   --   --    < > = values in this interval not displayed.   Inpatient medications:  amLODipine  10 mg Oral Daily   carvedilol  12.5 mg Oral BID WC   enoxaparin (LOVENOX) injection  40 mg Subcutaneous Q24H   melatonin  3 mg Oral QHS   mometasone-formoterol  2 puff Inhalation BID   pantoprazole  40 mg Oral BID   rosuvastatin  5 mg Oral QHS   sodium chloride flush  3 mL Intravenous Q12H    sodium chloride     sodium chloride, acetaminophen **OR** acetaminophen, HYDROcodone-acetaminophen, ondansetron (ZOFRAN) IV, sodium chloride flush       

## 2021-05-11 NOTE — Discharge Summary (Signed)
Physician Discharge Summary  Robert Garrett GHW:[id number] DOB: 1971-05-03 DOA: 05/09/2021  PCP: [name], MD  Admit date: 05/09/2021 Discharge date: 05/11/2021  Admitted From: Home Disposition: Home  Recommendations for Outpatient Follow-up:  Follow up with PCP in 1 week with repeat CBC/BMP Outpatient followup with Dr. Goldsborough/nephrology on 05/15/2021 Outpatient follow-up with General surgeon as scheduled Follow up in ED if symptoms worsen or new appear   Home Health: No Equipment/Devices: None  Discharge Condition: Stable CODE STATUS: Full Diet recommendation: Regular  Brief/Interim Summary: 50 year old male with history of hypertension, hyperlipidemia, recent left nephrectomy and removal of retroperitoneal mass on 04/26/2021 at [facility] with pathology consistent with leiomyoma presented with headache and fatigue along with poor oral intake and intermittent nausea/vomiting.  On presentation, his sodium was 116.  CT of the abdomen showed fluid collection around the left nephrectomy site.  ED provider discussed CT findings with surgeon at [facility] who thought that this was most likely seroma and no need for surgical intervention or transfer to [facility].  Nephrology was consulted.  During the hospitalization, his sodium level has gradually improved.  He received IV fluids initially which was subsequently discontinued by nephrology and he subsequently received 1 dose of tolvaptan.  His sodium is 132 this morning.  Nephrology has cleared the patient for discharge with close outpatient follow-up with nephrology next week.  He will need repeat BMP checked in the next few days.  Outpatient follow-up with PCP as well.    Discharge Diagnoses:   Acute hyponatremia -Possibly from poor oral intake and continued diuretic use (patient is on Hyzaar at home) -Presented with sodium of 116.  Treated with IV fluids.  Sodium improved slightly.   Nephrology was consulted.  IV fluids was discontinued.  He received 1 dose of tolvaptan. -His sodium is 132 this morning.  Nephrology has cleared the patient for discharge with close outpatient follow-up with nephrology next week.  He will need repeat BMP checked in the next few days.  Outpatient follow-up with PCP as well. -Hydrochlorothiazide has been discontinued.   Recent left nephrectomy and removal of retroperitoneal mass on 04/26/2021 -Pathology consistent for leiomyoma. -CT on admission showed fluid collection around surgical site. -ED provider discussed CT findings with surgeon at [facility] who thought that this was most likely seroma and no need for surgical intervention or transfer to [facility].   -Outpatient follow-up with general surgery   Hypertension -Monitor blood pressure.  Hyzaar discontinued.  Patient was started on amlodipine on 05/10/2021 by nephrology.  Blood pressure still on the higher side.  We will start Coreg 12.5 mg twice a day as per nephrology recommendations.  Antihypertensives can be titrated as an outpatient by PCP.    Hyperlipidemia -Continue rosuvastatin, patient takes 5 mg daily at bedtime.   Hypokalemia -Improved.  AKI -In the setting of recent nephrectomy.  Creatinine currently stable.  Follow BMP as an outpatient.  Outpatient follow-up with nephrology  Discharge Instructions   Allergies as of 05/11/2021   No Known Allergies      Medication List     STOP taking these medications    dicyclomine 20 MG tablet Commonly known as: BENTYL   losartan-hydrochlorothiazide 50-12.5 MG tablet Commonly known as: HYZAAR   meclizine 25 MG tablet Commonly known as: ANTIVERT   ondansetron 4 MG disintegrating tablet Commonly known as: ZOFRAN-ODT   potassium chloride 10 MEQ tablet Commonly known as: KLOR-CON  TAKE these medications    Advair HFA 45-21 MCG/ACT inhaler Generic drug: fluticasone-salmeterol Inhale 2  puffs into the lungs 2 (two) times daily.   albuterol 108 (90 Base) MCG/ACT inhaler Commonly known as: VENTOLIN HFA Inhale 1-2 puffs into the lungs every 4 (four) hours as needed for shortness of breath or wheezing.   amLODipine 10 MG tablet Commonly known as: NORVASC Take 1 tablet (10 mg total) by mouth daily. Start taking on: May 12, 2021   carvedilol 12.5 MG tablet Commonly known as: COREG Take 1 tablet (12.5 mg total) by mouth 2 (two) times daily with a meal.   fluticasone 50 MCG/ACT nasal spray Commonly known as: FLONASE Place 1 spray into both nostrils 2 (two) times daily.   oxyCODONE 5 MG immediate release tablet Commonly known as: Oxy IR/ROXICODONE Take 5 mg by mouth every 6 (six) hours as needed.   pantoprazole 40 MG tablet Commonly known as: PROTONIX Take 40 mg by mouth 2 (two) times daily.   rosuvastatin 10 MG tablet Commonly known as: CRESTOR Take 0.5 tablets (5 mg total) by mouth at bedtime. What changed: how much to take               Discharge Care Instructions  (From admission, onward)           Start     Ordered   05/11/21 0000  Discharge wound care:       Comments: As per recent recommendations from general surgery   05/11/21 1327             Follow-up Information     [name], Christean Grief, MD. Schedule an appointment as soon as possible for a visit in 1 week(s).   Specialty: Family Medicine Contact information: [place] [address] [phone]         Corliss Parish, MD Follow up.   Specialty: Nephrology Why: keep scheduled appointment on 05/15/21 Contact information: [place] [address] [phone]                No Known Allergies  Consultations: Nephrology   Procedures/Studies: DG Chest 2 View  Result Date: 05/09/2021 CLINICAL DATA:  History of recent nephrectomy with weakness, fatigue and headache. EXAM: CHEST - 2 VIEW COMPARISON:  None. FINDINGS:  Decreased lung volumes are seen with mild areas of bibasilar atelectasis. An ill-defined 1.0 cm diameter round opacity is seen overlying the lateral aspect of the right lung base. This is not seen on the prior study. There is no evidence of a pleural effusion or pneumothorax. The heart size and mediastinal contours are within normal limits. The visualized skeletal structures are unremarkable. IMPRESSION: 1. Mild bibasilar atelectasis. 2. Findings which may represent a right basilar lung nodule. Correlation with nonemergent chest CT is recommended. Electronically Signed   By: Virgina Norfolk M.D.   On: 05/09/2021 17:17   CT HEAD WO CONTRAST (5MM)  Result Date: 05/09/2021 CLINICAL DATA:  History of left nephrectomy on April 26, 2021 with weakness since surgery. Presents with frontal headache. EXAM: CT HEAD WITHOUT CONTRAST TECHNIQUE: Contiguous axial images were obtained from the base of the skull through the vertex without intravenous contrast. COMPARISON:  May 09, 2017 FINDINGS: Brain: No evidence of acute infarction, hemorrhage, hydrocephalus, extra-axial collection or mass lesion/mass effect. Vascular: No hyperdense vessel or unexpected calcification. Skull: Normal. Negative for fracture or focal lesion. Sinuses/Orbits: Marked severity bilateral maxillary sinus, bilateral ethmoid sinus, sphenoid sinus and frontal sinus mucosal thickening  is seen. Other: None. IMPRESSION: 1. No acute intracranial abnormality. 2. Marked severity paranasal sinus disease. Electronically Signed   By: Virgina Norfolk M.D.   On: 05/09/2021 17:13   CT Renal Stone Study  Result Date: 05/09/2021 CLINICAL DATA:  Kidney stone and flank pain.  Recent nephrectomy. EXAM: CT ABDOMEN AND PELVIS WITHOUT CONTRAST TECHNIQUE: Multidetector CT imaging of the abdomen and pelvis was performed following the standard protocol without IV contrast. COMPARISON:  CT enterography 01/24/2021. FINDINGS: Lower chest: There is atelectasis in  the lung bases. Hepatobiliary: No focal liver abnormality is seen. No gallstones, gallbladder wall thickening, or biliary dilatation. Pancreas: Unremarkable. No pancreatic ductal dilatation or surrounding inflammatory changes. Spleen: Normal in size without focal abnormality. Adrenals/Urinary Tract: Bilateral adrenal glands are within normal limits. The right kidney and ureter are within normal limits. There is stable nonspecific mild right perinephric fat stranding. No renal or ureteral calculi are seen. The bladder is within normal limits. Patient is status post left nephrectomy. There is fluid and stranding in the nephrectomy bed posteriorly and inferiorly. Fluid pocket inferiorly is largest and measures 5.0 x 3.6 x 6.5 cm. No air within the collection. Collection measures simple fluid. Stomach/Bowel: Stomach is within normal limits. Appendix appears normal. No evidence of bowel wall thickening, distention, or inflammatory changes. There are scattered colonic diverticula. There is no evidence for acute diverticulitis. Vascular/Lymphatic: No significant vascular findings are present. No enlarged abdominal or pelvic lymph nodes. Reproductive: Prostate is unremarkable. Other: There is a small amount of fluid in stranding tracking along the left retroperitoneum and left pericolic gutter. There is no free intraperitoneal air. No air-fluid levels are identified. There is some subcutaneous stranding in the anterior abdominal wall likely related to recent surgery. There are no abdominal wall fluid collections. Musculoskeletal: No acute or significant osseous findings. IMPRESSION: 1. Status post recent left nephrectomy. There is fluid pocket in the inferior surgical bed measuring 5.6 x 3.6 x 6.5 cm. 2. Minimal fluid tracking along left pericolic gutter. Electronically Signed   By: Ronney Asters M.D.   On: 05/09/2021 17:03      Subjective: Patient seen and examined at bedside.  Denies worsening abdominal pain, nausea,  vomiting.  Tolerating diet.  Feels okay to go home today.  Discharge Exam: Vitals:   05/11/21 0754 05/11/21 1200  BP:  (!) 133/95  Pulse:  96  Resp:  20  Temp:  97.7 F (36.5 C)  SpO2: 99% 99%    General: Pt is alert, awake, not in acute distress.  Currently on room air. Cardiovascular: rate controlled, S1/S2 + Respiratory: bilateral decreased breath sounds at bases Abdominal: Soft, slightly distended, nontender, bowel sounds +. Midline surgical incision is healing well.  Extremities: no edema, no cyanosis    The results of significant diagnostics from this hospitalization (including imaging, microbiology, ancillary and laboratory) are listed below for reference.     Microbiology: Recent Results (from the past 240 hour(s))  Resp Panel by RT-PCR (Flu A&B, Covid) Nasopharyngeal Swab     Status: None   Collection Time: 05/09/21  4:21 PM   Specimen: Nasopharyngeal Swab; Nasopharyngeal(NP) swabs in vial transport medium  Result Value Ref Range Status   SARS Coronavirus 2 by RT PCR NEGATIVE NEGATIVE Final    Comment: (NOTE) SARS-CoV-2 target nucleic acids are NOT DETECTED.  The SARS-CoV-2 RNA is generally detectable in upper respiratory specimens during the acute phase of infection. The lowest concentration of SARS-CoV-2 viral copies this assay can detect is 138 copies/mL. A  negative result does not preclude SARS-Cov-2 infection and should not be used as the sole basis for treatment or other patient management decisions. A negative result may occur with  improper specimen collection/handling, submission of specimen other than nasopharyngeal swab, presence of viral mutation(s) within the areas targeted by this assay, and inadequate number of viral copies(<138 copies/mL). A negative result must be combined with clinical observations, patient history, and epidemiological information. The expected result is Negative.  Fact Sheet for Patients:   EntrepreneurPulse.com.au  Fact Sheet for Healthcare Providers:  IncredibleEmployment.be  This test is no t yet approved or cleared by the [place] FDA and  has been authorized for detection and/or diagnosis of SARS-CoV-2 by FDA under an Emergency Use Authorization (EUA). This EUA will remain  in effect (meaning this test can be used) for the duration of the COVID-19 declaration under Section 564(b)(1) of the Act, 21 U.S.C.section 360bbb-3(b)(1), unless the authorization is terminated  or revoked sooner.       Influenza A by PCR NEGATIVE NEGATIVE Final   Influenza B by PCR NEGATIVE NEGATIVE Final    Comment: (NOTE) The Xpert Xpress SARS-CoV-2/FLU/RSV plus assay is intended as an aid in the diagnosis of influenza from Nasopharyngeal swab specimens and should not be used as a sole basis for treatment. Nasal washings and aspirates are unacceptable for Xpert Xpress SARS-CoV-2/FLU/RSV testing.  Fact Sheet for Patients: EntrepreneurPulse.com.au  Fact Sheet for Healthcare Providers: IncredibleEmployment.be  This test is not yet approved or cleared by the [place] FDA and has been authorized for detection and/or diagnosis of SARS-CoV-2 by FDA under an Emergency Use Authorization (EUA). This EUA will remain in effect (meaning this test can be used) for the duration of the COVID-19 declaration under Section 564(b)(1) of the Act, 21 U.S.C. section 360bbb-3(b)(1), unless the authorization is terminated or revoked.  Performed at [facility], [address] [place], [place] [address]      Labs: BNP (last 3 results) No results for input(s): BNP in the last 8760 hours. Basic Metabolic Panel: Recent Labs  Lab 05/09/21 2340 05/10/21 0146 05/10/21 0657 05/10/21 1248 05/10/21 1255 05/10/21 2037 05/11/21 0515  NA 120* 121* 121*  --  123* 125* 132*  K 3.1* 3.3* 3.5  --  3.5  --  3.8  CL  85* 85* 85*  --  89*  --  99  CO2 24 25 24   --  25  --  25  GLUCOSE 90 84 85  --  98  --  94  BUN 11 11 11   --  11  --  12  CREATININE 1.29* 1.39* 1.31*  --  1.08  --  1.27*  CALCIUM 8.7* 8.9 8.8*  --  9.0  --  9.4  MG 2.2 2.1  --  2.2  --   --  2.5*  PHOS 3.2 3.3  --   --   --   --   --    Liver Function Tests: Recent Labs  Lab 05/09/21 1415 05/10/21 0146 05/10/21 1255  AST 20 18 19   ALT 23 22 22   ALKPHOS 76 70 71  BILITOT 0.6 0.7 0.6  PROT 8.4* 7.4 7.5  ALBUMIN 4.1 3.6 3.7   No results for input(s): LIPASE, AMYLASE in the last 168 hours. No results for input(s): AMMONIA in the last 168 hours. CBC: Recent Labs  Lab 05/09/21 1415 05/10/21 0146  WBC 7.7 7.8  NEUTROABS 4.3 4.6  HGB 14.1 12.9*  HCT 38.5*  35.4*  MCV 83.5 84.1  PLT 339 294   Cardiac Enzymes: Recent Labs  Lab 05/09/21 2340  CKTOTAL 57   BNP: Invalid input(s): POCBNP CBG: No results for input(s): GLUCAP in the last 168 hours. D-Dimer No results for input(s): DDIMER in the last 72 hours. Hgb A1c No results for input(s): HGBA1C in the last 72 hours. Lipid Profile No results for input(s): CHOL, HDL, LDLCALC, TRIG, CHOLHDL, LDLDIRECT in the last 72 hours. Thyroid function studies Recent Labs    05/10/21 0146  TSH 2.322   Anemia work up No results for input(s): VITAMINB12, FOLATE, FERRITIN, TIBC, IRON, RETICCTPCT in the last 72 hours. Urinalysis    Component Value Date/Time   COLORURINE STRAW (A) 05/09/2021 1415   APPEARANCEUR CLEAR 05/09/2021 1415   LABSPEC 1.015 05/09/2021 1415   PHURINE 7.0 05/09/2021 1415   GLUCOSEU NEGATIVE 05/09/2021 1415   HGBUR NEGATIVE 05/09/2021 1415   BILIRUBINUR NEGATIVE 05/09/2021 1415   KETONESUR NEGATIVE 05/09/2021 1415   PROTEINUR NEGATIVE 05/09/2021 1415   NITRITE NEGATIVE 05/09/2021 1415   LEUKOCYTESUR NEGATIVE 05/09/2021 1415   Sepsis Labs Invalid input(s): PROCALCITONIN,  WBC,  LACTICIDVEN Microbiology Recent Results (from the past 240 hour(s))   Resp Panel by RT-PCR (Flu A&B, Covid) Nasopharyngeal Swab     Status: None   Collection Time: 05/09/21  4:21 PM   Specimen: Nasopharyngeal Swab; Nasopharyngeal(NP) swabs in vial transport medium  Result Value Ref Range Status   SARS Coronavirus 2 by RT PCR NEGATIVE NEGATIVE Final    Comment: (NOTE) SARS-CoV-2 target nucleic acids are NOT DETECTED.  The SARS-CoV-2 RNA is generally detectable in upper respiratory specimens during the acute phase of infection. The lowest concentration of SARS-CoV-2 viral copies this assay can detect is 138 copies/mL. A negative result does not preclude SARS-Cov-2 infection and should not be used as the sole basis for treatment or other patient management decisions. A negative result may occur with  improper specimen collection/handling, submission of specimen other than nasopharyngeal swab, presence of viral mutation(s) within the areas targeted by this assay, and inadequate number of viral copies(<138 copies/mL). A negative result must be combined with clinical observations, patient history, and epidemiological information. The expected result is Negative.  Fact Sheet for Patients:  EntrepreneurPulse.com.au  Fact Sheet for Healthcare Providers:  IncredibleEmployment.be  This test is no t yet approved or cleared by the [place] FDA and  has been authorized for detection and/or diagnosis of SARS-CoV-2 by FDA under an Emergency Use Authorization (EUA). This EUA will remain  in effect (meaning this test can be used) for the duration of the COVID-19 declaration under Section 564(b)(1) of the Act, 21 U.S.C.section 360bbb-3(b)(1), unless the authorization is terminated  or revoked sooner.       Influenza A by PCR NEGATIVE NEGATIVE Final   Influenza B by PCR NEGATIVE NEGATIVE Final    Comment: (NOTE) The Xpert Xpress SARS-CoV-2/FLU/RSV plus assay is intended as an aid in the diagnosis of influenza from  Nasopharyngeal swab specimens and should not be used as a sole basis for treatment. Nasal washings and aspirates are unacceptable for Xpert Xpress SARS-CoV-2/FLU/RSV testing.  Fact Sheet for Patients: EntrepreneurPulse.com.au  Fact Sheet for Healthcare Providers: IncredibleEmployment.be  This test is not yet approved or cleared by the [place] FDA and has been authorized for detection and/or diagnosis of SARS-CoV-2 by FDA under an Emergency Use Authorization (EUA). This EUA will remain in effect (meaning this test can be used) for the duration of the COVID-19 declaration  under Section 564(b)(1) of the Act, 21 U.S.C. section 360bbb-3(b)(1), unless the authorization is terminated or revoked.  Performed at [facility], [address] [place], [place] [address]      Time coordinating discharge: 35 minutes  SIGNED:   Aline August, MD  [facility] 05/11/2021, 1:10 PM

## 2021-05-11 NOTE — Plan of Care (Signed)
Discharged orders received and reviewed with pt and wife. Pt allowed to ask questions. No concerns identified. PIV left AC removed without any complications. Follow up appt scheduled. Rx send to pharmacy of preference. Pt denies any pain at this time. Discharged to care of spouse via wheelchair; no belongings left at bedside.     Problem: Clinical Measurements: Goal: Ability to maintain clinical measurements within normal limits will improve Outcome: Completed/Met Goal: Will remain free from infection Outcome: Completed/Met   Problem: Nutrition: Goal: Adequate nutrition will be maintained Outcome: Completed/Met   Problem: Pain Managment: Goal: General experience of comfort will improve Outcome: Completed/Met   Problem: Safety: Goal: Ability to remain free from injury will improve Outcome: Completed/Met   Problem: Skin Integrity: Goal: Risk for impaired skin integrity will decrease Outcome: Completed/Met

## 2021-06-08 ENCOUNTER — Encounter (HOSPITAL_COMMUNITY): Payer: Self-pay | Admitting: Radiology

## 2021-08-23 ENCOUNTER — Other Ambulatory Visit: Payer: Self-pay | Admitting: Internal Medicine

## 2022-05-14 DIAGNOSIS — Z905 Acquired absence of kidney: Secondary | ICD-10-CM | POA: Diagnosis not present

## 2022-05-14 DIAGNOSIS — Z8589 Personal history of malignant neoplasm of other organs and systems: Secondary | ICD-10-CM | POA: Diagnosis not present

## 2022-05-14 DIAGNOSIS — Z08 Encounter for follow-up examination after completed treatment for malignant neoplasm: Secondary | ICD-10-CM | POA: Diagnosis not present

## 2022-05-14 DIAGNOSIS — C480 Malignant neoplasm of retroperitoneum: Secondary | ICD-10-CM | POA: Diagnosis not present

## 2022-05-22 DIAGNOSIS — I10 Essential (primary) hypertension: Secondary | ICD-10-CM | POA: Diagnosis not present

## 2022-05-22 DIAGNOSIS — E669 Obesity, unspecified: Secondary | ICD-10-CM | POA: Diagnosis not present

## 2022-05-22 DIAGNOSIS — R9389 Abnormal findings on diagnostic imaging of other specified body structures: Secondary | ICD-10-CM | POA: Diagnosis not present

## 2022-05-22 DIAGNOSIS — J4521 Mild intermittent asthma with (acute) exacerbation: Secondary | ICD-10-CM | POA: Diagnosis not present

## 2022-06-04 DIAGNOSIS — R918 Other nonspecific abnormal finding of lung field: Secondary | ICD-10-CM | POA: Diagnosis not present

## 2022-06-04 DIAGNOSIS — Z905 Acquired absence of kidney: Secondary | ICD-10-CM | POA: Diagnosis not present

## 2022-06-05 DIAGNOSIS — Z0000 Encounter for general adult medical examination without abnormal findings: Secondary | ICD-10-CM | POA: Diagnosis not present

## 2022-06-05 DIAGNOSIS — E7800 Pure hypercholesterolemia, unspecified: Secondary | ICD-10-CM | POA: Diagnosis not present

## 2022-06-05 DIAGNOSIS — J4521 Mild intermittent asthma with (acute) exacerbation: Secondary | ICD-10-CM | POA: Diagnosis not present

## 2022-06-05 DIAGNOSIS — K219 Gastro-esophageal reflux disease without esophagitis: Secondary | ICD-10-CM | POA: Diagnosis not present

## 2022-06-05 DIAGNOSIS — I10 Essential (primary) hypertension: Secondary | ICD-10-CM | POA: Diagnosis not present

## 2022-11-08 DIAGNOSIS — R0989 Other specified symptoms and signs involving the circulatory and respiratory systems: Secondary | ICD-10-CM | POA: Diagnosis not present

## 2022-11-08 DIAGNOSIS — J339 Nasal polyp, unspecified: Secondary | ICD-10-CM | POA: Diagnosis not present

## 2022-11-08 DIAGNOSIS — Z9889 Other specified postprocedural states: Secondary | ICD-10-CM | POA: Diagnosis not present

## 2022-11-08 DIAGNOSIS — J324 Chronic pansinusitis: Secondary | ICD-10-CM | POA: Diagnosis not present

## 2022-11-08 DIAGNOSIS — J301 Allergic rhinitis due to pollen: Secondary | ICD-10-CM | POA: Diagnosis not present

## 2023-01-01 DIAGNOSIS — J324 Chronic pansinusitis: Secondary | ICD-10-CM | POA: Diagnosis not present

## 2023-01-01 DIAGNOSIS — J339 Nasal polyp, unspecified: Secondary | ICD-10-CM | POA: Diagnosis not present

## 2023-01-04 DIAGNOSIS — J339 Nasal polyp, unspecified: Secondary | ICD-10-CM | POA: Diagnosis not present

## 2023-01-04 DIAGNOSIS — J301 Allergic rhinitis due to pollen: Secondary | ICD-10-CM | POA: Diagnosis not present

## 2023-01-04 DIAGNOSIS — J324 Chronic pansinusitis: Secondary | ICD-10-CM | POA: Diagnosis not present

## 2023-01-04 DIAGNOSIS — Z9889 Other specified postprocedural states: Secondary | ICD-10-CM | POA: Diagnosis not present

## 2023-01-25 DIAGNOSIS — R0789 Other chest pain: Secondary | ICD-10-CM | POA: Diagnosis not present

## 2023-01-25 DIAGNOSIS — R079 Chest pain, unspecified: Secondary | ICD-10-CM | POA: Diagnosis not present

## 2023-01-25 DIAGNOSIS — R0609 Other forms of dyspnea: Secondary | ICD-10-CM | POA: Diagnosis not present

## 2023-01-25 DIAGNOSIS — R5383 Other fatigue: Secondary | ICD-10-CM | POA: Diagnosis not present

## 2023-02-16 DIAGNOSIS — U071 COVID-19: Secondary | ICD-10-CM | POA: Diagnosis not present

## 2023-02-16 DIAGNOSIS — R519 Headache, unspecified: Secondary | ICD-10-CM | POA: Diagnosis not present

## 2023-02-16 DIAGNOSIS — R0981 Nasal congestion: Secondary | ICD-10-CM | POA: Diagnosis not present

## 2023-10-31 DIAGNOSIS — I10 Essential (primary) hypertension: Secondary | ICD-10-CM | POA: Diagnosis not present

## 2023-10-31 DIAGNOSIS — Z6838 Body mass index (BMI) 38.0-38.9, adult: Secondary | ICD-10-CM | POA: Diagnosis not present

## 2023-10-31 DIAGNOSIS — E66812 Obesity, class 2: Secondary | ICD-10-CM | POA: Diagnosis not present

## 2023-10-31 DIAGNOSIS — E7800 Pure hypercholesterolemia, unspecified: Secondary | ICD-10-CM | POA: Diagnosis not present

## 2023-10-31 DIAGNOSIS — G4733 Obstructive sleep apnea (adult) (pediatric): Secondary | ICD-10-CM | POA: Diagnosis not present

## 2023-10-31 DIAGNOSIS — R0609 Other forms of dyspnea: Secondary | ICD-10-CM | POA: Diagnosis not present

## 2023-10-31 DIAGNOSIS — I4510 Unspecified right bundle-branch block: Secondary | ICD-10-CM | POA: Diagnosis not present

## 2023-10-31 DIAGNOSIS — R5382 Chronic fatigue, unspecified: Secondary | ICD-10-CM | POA: Diagnosis not present

## 2023-11-05 ENCOUNTER — Ambulatory Visit: Attending: Cardiology | Admitting: Cardiology

## 2023-11-05 ENCOUNTER — Encounter: Payer: Self-pay | Admitting: Cardiology

## 2023-11-05 VITALS — BP 130/62 | HR 72 | Ht 67.0 in | Wt 240.8 lb

## 2023-11-05 DIAGNOSIS — I10 Essential (primary) hypertension: Secondary | ICD-10-CM

## 2023-11-05 DIAGNOSIS — E66812 Obesity, class 2: Secondary | ICD-10-CM | POA: Diagnosis not present

## 2023-11-05 DIAGNOSIS — Z6837 Body mass index (BMI) 37.0-37.9, adult: Secondary | ICD-10-CM

## 2023-11-05 DIAGNOSIS — E6609 Other obesity due to excess calories: Secondary | ICD-10-CM

## 2023-11-05 DIAGNOSIS — E782 Mixed hyperlipidemia: Secondary | ICD-10-CM | POA: Diagnosis not present

## 2023-11-05 DIAGNOSIS — R0609 Other forms of dyspnea: Secondary | ICD-10-CM

## 2023-11-05 MED ORDER — CARVEDILOL 12.5 MG PO TABS
12.5000 mg | ORAL_TABLET | Freq: Two times a day (BID) | ORAL | 0 refills | Status: AC
Start: 2023-11-05 — End: 2024-02-03

## 2023-11-05 MED ORDER — OLMESARTAN MEDOXOMIL-HCTZ 20-12.5 MG PO TABS: 1.0000 | ORAL_TABLET | ORAL | 1 refills | Status: AC

## 2023-11-05 NOTE — Progress Notes (Unsigned)
 Cardiology Office Note:  .   Date:  11/06/2023  ID:  Robert Garrett, DOB [birth year], MRN [id number] PCP: Barrington Ellison  [facility] Providers Cardiologist:  Yates Decamp, MD   History of Present Illness: .   Robert Garrett is a 53 y.o.   Discussed the use of AI scribe software for clinical note transcription with the patient, who gave verbal consent to proceed.  History of Present Illness   A 53 year old patient with a history of obesity, hypertension, and high triglycerides presents with a chief complaint of shortness of breath and fatigue that has been ongoing for over a year and has gradually worsened. The patient reports a decrease in exercise tolerance, with a notable increase in recovery time after walking for 30 minutes on a treadmill. The patient also experiences dizziness after exercise and needs to catch his breath after climbing stairs. The patient has a history of smoking but quit a long time ago. The patient's diet is non-vegetarian, and he admits to overeating and drinking alcohol, although he has recently stopped drinking. The patient's parents both died of cancer, and he has a family history of heart disease. The patient is currently on carvedilol and amlodipine for hypertension.      Labs   External Labs:  Care everywhere labs 10/31/2023:  A1c 5.6%.  TSH normal at 2.87.  Vitamin D 44.  Total cholesterol 123, triglycerides 226, HDL 48, LDL 47.  Sodium 139, potassium 3.8, BUN 8, creatinine 1.21, EGFR 72 mL, serum glucose 91 mg.  Review of Systems  Cardiovascular:  Positive for dyspnea on exertion. Negative for chest pain and leg swelling.  Respiratory:  Positive for snoring.    Physical Exam:   VS:  BP 130/62   Pulse 72   Ht 5\' 7"  (1.702 m)   Wt 240 lb 12.8 oz (109.2 kg)   SpO2 94%   BMI 37.71 kg/m    Wt Readings from Last 3 Encounters:  11/05/23 240 lb 12.8 oz (109.2 kg)  05/10/21 213 lb 13.5 oz (97 kg)  10/20/20 213 lb (96.6 kg)    Physical  Exam Constitutional:      Appearance: He is obese.  Neck:     Vascular: No carotid bruit or JVD.  Cardiovascular:     Rate and Rhythm: Normal rate and regular rhythm.     Pulses: Intact distal pulses.     Heart sounds: Normal heart sounds. No murmur heard.    No gallop.  Pulmonary:     Effort: Pulmonary effort is normal.     Breath sounds: Normal breath sounds.  Abdominal:     General: Bowel sounds are normal.     Palpations: Abdomen is soft.  Musculoskeletal:     Right lower leg: No edema.     Left lower leg: No edema.    Studies Reviewed: Marland Kitchen     EKG:    EKG Interpretation Date/Time:  Tuesday November 05 2023 14:45:51 EDT Ventricular Rate:  72 PR Interval:  190 QRS Duration:  84 QT Interval:  394 QTC Calculation: 431 R Axis:   85  Text Interpretation: EKG 11/05/2023: Normal sinus rhythm at the rate of 72 bpm, early repolarization.  Incomplete right bundle branch block.  No evidence of ischemia. Compared to 05/10/2021, no change. Confirmed by Delrae Rend [phone]) on 11/05/2023 3:14:37 PM    Medications and allergies    No Known Allergies   Current Outpatient Medications:    ADVAIR HFA 45-21 MCG/ACT  inhaler, Inhale 2 puffs into the lungs 2 (two) times daily., Disp: , Rfl:    albuterol (VENTOLIN HFA) 108 (90 Base) MCG/ACT inhaler, Inhale 1-2 puffs into the lungs every 4 (four) hours as needed for shortness of breath or wheezing., Disp: , Rfl:    amLODipine (NORVASC) 10 MG tablet, Take 1 tablet (10 mg total) by mouth daily., Disp: 30 tablet, Rfl: 3   carvedilol (COREG) 12.5 MG tablet, Take 1 tablet (12.5 mg total) by mouth 2 (two) times daily., Disp: 180 tablet, Rfl: 0   fluticasone (FLONASE) 50 MCG/ACT nasal spray, Place 1 spray into both nostrils 2 (two) times daily., Disp: , Rfl:    olmesartan-hydrochlorothiazide (BENICAR HCT) 20-12.5 MG tablet, Take 1 tablet by mouth every morning., Disp: 30 tablet, Rfl: 1   pantoprazole (PROTONIX) 40 MG tablet, Take 40 mg by mouth 2  (two) times daily., Disp: , Rfl:    rosuvastatin (CRESTOR) 10 MG tablet, Take 0.5 tablets (5 mg total) by mouth at bedtime., Disp: , Rfl:    ASSESSMENT AND PLAN: .      ICD-10-CM   1. Dyspnea on exertion  R06.09 EKG 12-Lead    Informed Consent Details: Physician/Practitioner Attestation; Transcribe to consent form and obtain patient signature    ECHOCARDIOGRAM COMPLETE    EXERCISE TOLERANCE TEST (ETT)    Basic Metabolic Panel (BMET)    2. Primary hypertension  I10 Informed Consent Details: Physician/Practitioner Attestation; Transcribe to consent form and obtain patient signature    olmesartan-hydrochlorothiazide (BENICAR HCT) 20-12.5 MG tablet    carvedilol (COREG) 12.5 MG tablet    ECHOCARDIOGRAM COMPLETE    EXERCISE TOLERANCE TEST (ETT)    Basic Metabolic Panel (BMET)    3. Mixed hyperlipidemia  E78.2     4. Class 2 obesity due to excess calories without serious comorbidity with body mass index (BMI) of 37.0 to 37.9 in adult  E66.812    E66.09    Z68.37      Assessment and Plan    Shortness of breath   He has experienced shortness of breath for over a year, worsening gradually. Differential diagnosis includes atrial fibrillation, CAD, obesity-related hypoventilation, and diastolic dysfunction. EKG is normal, and there is no leg swelling. The condition is likely related to obesity, causing increased pressure on the lungs and diastolic dysfunction, leading to reduced cardiac relaxation and efficiency.  A stress test and echocardiogram will help determine if cardiac issues are present. If normal, symptoms may be attributed to obesity and lifestyle factors. Order an echocardiogram and schedule a treadmill stress test. Hold carvedilol the night before and the day of the stress test.  Hypertension   He is on carvedilol and amlodipine. The carvedilol dose was increased to 25 mg due to uncontrolled blood pressure. Plan to reduce the carvedilol dose and add an angiotensin receptor blocker  (ARB) for better management and to improve diastology. Discussed the benefits of ARBs in improving breathing and overall cardiovascular health. Reduce carvedilol dose to 12.5 mg and add Benicar HCT 12.5 mg once daily.  Check BMP in 2 to 3 weeks.  Follow up with PCP for further management.  Obesity   He is morbidly obese with a BMI of 37, significant for an [place]. This contributes to shortness of breath and may lead to metabolic syndrome. Triglycerides are elevated at 226 mg/dL, diet-related. Advised on lifestyle changes, including diet and exercise. Discussed potential use of weight loss medications such as Zepbound if lifestyle changes are insufficient. Emphasized slow eating  and recognizing fullness to aid weight management. Encourage lifestyle changes including diet modification and increased physical activity. Discuss potential use of weight loss medications such as Zepbound. Refer to PCP for further management and potential prescription of weight loss medication.  Hypertriglyceridemia   Triglyceride level is elevated at 226 mg/dL, attributed to dietary habits. LDL is well controlled with rosuvastatin, and there is no diabetes. Advised dietary modifications to reduce triglyceride levels. Continue current cholesterol management with rosuvastatin.  Follow-up   He requires follow-up for management of conditions and to assess treatment plan effectiveness. Blood work is needed to monitor response to medication changes. Schedule a follow-up appointment with PCP. Perform blood work (BMP) in three weeks. Follow up with cardiologist as needed.   Signed,  Yates Decamp, MD, [facility] 11/06/2023, 10:41 PM [facility] [address] [place], [place] [address] Phone: [phone]. Fax:  [phone]

## 2023-11-05 NOTE — Patient Instructions (Addendum)
 Medication Instructions:  Your physician has recommended you make the following change in your medication:  Decrease carvedilol to 12.5 mg by mouth twice daily  Start Olmesartan hydrochlorothiazide 20/12.5 mg by mouth daily   *If you need a refill on your cardiac medications before your next appointment, please call your pharmacy*   Lab Work: Have lab work done in 3 weeks.  BMP.  This is not fasting.  Can be done at any [facility].  There is an office on the first floor of our building If you have labs (blood work) drawn today and your tests are completely normal, you will receive your results only by: MyChart Message (if you have MyChart) OR A paper copy in the mail If you have any lab test that is abnormal or we need to change your treatment, we will call you to review the results.   Testing/Procedures: Your physician has requested that you have an exercise tolerance test. For further information please visit [email or web address] Please also follow instruction sheet, as given.  Your physician has requested that you have an echocardiogram. Echocardiography is a painless test that uses sound waves to create images of your heart. It provides your doctor with information about the size and shape of your heart and how well your heart's chambers and valves are working. This procedure takes approximately one hour. There are no restrictions for this procedure. Please do NOT wear cologne, perfume, aftershave, or lotions (deodorant is allowed). Please arrive 15 minutes prior to your appointment time.  Please note: We ask at that you not bring children with you during ultrasound (echo/ vascular) testing. Due to room size and safety concerns, children are not allowed in the ultrasound rooms during exams. Our front office staff cannot provide observation of children in our lobby area while testing is being conducted. An adult accompanying a patient to their appointment will only be allowed in the ultrasound  room at the discretion of the ultrasound technician under special circumstances. We apologize for any inconvenience.    Follow-Up: At [facility], you and your health needs are our priority.  As part of our continuing mission to provide you with exceptional heart care, we have created designated Provider Care Teams.  These Care Teams include your primary Cardiologist (physician) and Advanced Practice Providers (APPs -  Physician Assistants and Nurse Practitioners) who all work together to provide you with the care you need, when you need it.  We recommend signing up for the patient portal called "MyChart".  Sign up information is provided on this After Visit Summary.  MyChart is used to connect with patients for Virtual Visits (Telemedicine).  Patients are able to view lab/test results, encounter notes, upcoming appointments, etc.  Non-urgent messages can be sent to your provider as well.   To learn more about what you can do with MyChart, go to [email or web address].    Your next appointment:   As needed  Provider:   Yates Decamp, MD     Other Instructions .   Please arrive 15 minutes prior to your appointment time to allow for registration and insurance purposes.  The test will take approximately 45 minutes to complete.  How to prepare for your Exercise Stress Test: - Do bring a list of your current medications with you. If you do not take any of the medications listed below, you may take your medications as normal the day of the test. Do not take Carvedilol evening before and morning of test prior to stress test -  DO wear comfortable clothes (no dresses or overalls) and walking shoes, tennis shoes preferred (no heels or open toed shoes allowed).

## 2023-11-26 DIAGNOSIS — R0609 Other forms of dyspnea: Secondary | ICD-10-CM | POA: Diagnosis not present

## 2023-11-26 DIAGNOSIS — I10 Essential (primary) hypertension: Secondary | ICD-10-CM | POA: Diagnosis not present

## 2023-11-27 ENCOUNTER — Encounter: Payer: Self-pay | Admitting: Cardiology

## 2023-11-27 ENCOUNTER — Ambulatory Visit (HOSPITAL_COMMUNITY): Attending: Cardiovascular Disease

## 2023-11-27 DIAGNOSIS — I10 Essential (primary) hypertension: Secondary | ICD-10-CM | POA: Insufficient documentation

## 2023-11-27 DIAGNOSIS — R0609 Other forms of dyspnea: Secondary | ICD-10-CM | POA: Diagnosis not present

## 2023-11-27 LAB — ECHOCARDIOGRAM COMPLETE
Area-P 1/2: 4.06 cm2
Est EF: 60
S' Lateral: 2.5 cm

## 2023-11-27 LAB — BASIC METABOLIC PANEL WITH GFR
BUN/Creatinine Ratio: 7 — ABNORMAL LOW (ref 9–20)
BUN: 9 mg/dL (ref 6–24)
CO2: 22 mmol/L (ref 20–29)
Calcium: 9.9 mg/dL (ref 8.7–10.2)
Chloride: 102 mmol/L (ref 96–106)
Creatinine, Ser: 1.36 mg/dL — ABNORMAL HIGH (ref 0.76–1.27)
Glucose: 98 mg/dL (ref 70–99)
Potassium: 4.7 mmol/L (ref 3.5–5.2)
Sodium: 140 mmol/L (ref 134–144)
eGFR: 62 mL/min/{1.73_m2} (ref >59)

## 2023-11-27 NOTE — Progress Notes (Signed)
 Normal echocardiogram

## 2023-11-28 ENCOUNTER — Ambulatory Visit: Attending: Cardiology

## 2023-11-28 DIAGNOSIS — I10 Essential (primary) hypertension: Secondary | ICD-10-CM

## 2023-11-28 DIAGNOSIS — R0609 Other forms of dyspnea: Secondary | ICD-10-CM

## 2023-11-29 ENCOUNTER — Encounter: Payer: Self-pay | Admitting: Cardiology

## 2023-11-29 LAB — EXERCISE TOLERANCE TEST
Angina Index: 0
Duke Treadmill Score: 7
Estimated workload: 8.4
Exercise duration (min): 7 min
Exercise duration (sec): 0 s
MPHR: 167 {beats}/min
Peak HR: 151 {beats}/min
Percent HR: 90 %
RPE: 17
Rest HR: 62 {beats}/min
ST Depression (mm): 0 mm

## 2023-11-29 NOTE — Progress Notes (Signed)
 Treadmill Exercise Stress 11/29/23  Normal stress EKG using Bruce protocol. Exercise duration 7 minutes and 8.4 METS workload.  Normal blood pressure response.

## 2024-01-08 DIAGNOSIS — Z0000 Encounter for general adult medical examination without abnormal findings: Secondary | ICD-10-CM | POA: Diagnosis not present

## 2024-01-08 DIAGNOSIS — E7800 Pure hypercholesterolemia, unspecified: Secondary | ICD-10-CM | POA: Diagnosis not present

## 2024-01-08 DIAGNOSIS — J45909 Unspecified asthma, uncomplicated: Secondary | ICD-10-CM | POA: Diagnosis not present

## 2024-01-08 DIAGNOSIS — R208 Other disturbances of skin sensation: Secondary | ICD-10-CM | POA: Diagnosis not present

## 2024-01-08 DIAGNOSIS — G2581 Restless legs syndrome: Secondary | ICD-10-CM | POA: Diagnosis not present

## 2024-01-08 DIAGNOSIS — Z125 Encounter for screening for malignant neoplasm of prostate: Secondary | ICD-10-CM | POA: Diagnosis not present

## 2024-01-08 DIAGNOSIS — E559 Vitamin D deficiency, unspecified: Secondary | ICD-10-CM | POA: Diagnosis not present

## 2024-01-08 DIAGNOSIS — Z131 Encounter for screening for diabetes mellitus: Secondary | ICD-10-CM | POA: Diagnosis not present

## 2024-01-08 DIAGNOSIS — I10 Essential (primary) hypertension: Secondary | ICD-10-CM | POA: Diagnosis not present

## 2024-01-23 ENCOUNTER — Emergency Department (HOSPITAL_BASED_OUTPATIENT_CLINIC_OR_DEPARTMENT_OTHER)
Admission: EM | Admit: 2024-01-23 | Discharge: 2024-01-23 | Disposition: A | Discharge status: Home / Self Care | Attending: Emergency Medicine | Admitting: Emergency Medicine

## 2024-01-23 ENCOUNTER — Other Ambulatory Visit: Payer: Self-pay

## 2024-01-23 ENCOUNTER — Emergency Department (HOSPITAL_BASED_OUTPATIENT_CLINIC_OR_DEPARTMENT_OTHER)

## 2024-01-23 ENCOUNTER — Encounter (HOSPITAL_BASED_OUTPATIENT_CLINIC_OR_DEPARTMENT_OTHER): Payer: Self-pay

## 2024-01-23 DIAGNOSIS — R0602 Shortness of breath: Secondary | ICD-10-CM | POA: Diagnosis not present

## 2024-01-23 DIAGNOSIS — E669 Obesity, unspecified: Secondary | ICD-10-CM | POA: Insufficient documentation

## 2024-01-23 DIAGNOSIS — Z79899 Other long term (current) drug therapy: Secondary | ICD-10-CM | POA: Diagnosis not present

## 2024-01-23 DIAGNOSIS — K30 Functional dyspepsia: Secondary | ICD-10-CM | POA: Insufficient documentation

## 2024-01-23 DIAGNOSIS — R0609 Other forms of dyspnea: Secondary | ICD-10-CM | POA: Diagnosis not present

## 2024-01-23 DIAGNOSIS — I10 Essential (primary) hypertension: Secondary | ICD-10-CM | POA: Diagnosis not present

## 2024-01-23 LAB — TROPONIN T, HIGH SENSITIVITY: Troponin T High Sensitivity: <15 ng/L (ref <19)

## 2024-01-23 LAB — URINALYSIS, ROUTINE W REFLEX MICROSCOPIC
Bilirubin Urine: NEGATIVE
Glucose, UA: NEGATIVE mg/dL
Hgb urine dipstick: NEGATIVE
Ketones, ur: NEGATIVE mg/dL
Leukocytes,Ua: NEGATIVE
Nitrite: NEGATIVE
Protein, ur: NEGATIVE mg/dL
Specific Gravity, Urine: 1.01 (ref 1.005–1.030)
pH: 6.5 (ref 5.0–8.0)

## 2024-01-23 LAB — CBC WITH DIFFERENTIAL/PLATELET
Abs Immature Granulocytes: 0.02 10*3/uL (ref 0.00–0.07)
Basophils Absolute: 0.1 10*3/uL (ref 0.0–0.1)
Basophils Relative: 1 %
Eosinophils Absolute: 0.5 10*3/uL (ref 0.0–0.5)
Eosinophils Relative: 7 %
HCT: 43.5 % (ref 39.0–52.0)
Hemoglobin: 14.4 g/dL (ref 13.0–17.0)
Immature Granulocytes: 0 %
Lymphocytes Relative: 24 %
Lymphs Abs: 1.6 10*3/uL (ref 0.7–4.0)
MCH: 29.3 pg (ref 26.0–34.0)
MCHC: 33.1 g/dL (ref 30.0–36.0)
MCV: 88.6 fL (ref 80.0–100.0)
Monocytes Absolute: 0.5 10*3/uL (ref 0.1–1.0)
Monocytes Relative: 8 %
Neutro Abs: 3.9 10*3/uL (ref 1.7–7.7)
Neutrophils Relative %: 60 %
Platelets: 205 10*3/uL (ref 150–400)
RBC: 4.91 MIL/uL (ref 4.22–5.81)
RDW: 12.8 % (ref 11.5–15.5)
WBC: 6.5 10*3/uL (ref 4.0–10.5)
nRBC: 0 % (ref 0.0–0.2)

## 2024-01-23 LAB — LIPASE, BLOOD: Lipase: 21 U/L (ref 11–51)

## 2024-01-23 LAB — COMPREHENSIVE METABOLIC PANEL WITH GFR
ALT: 14 U/L (ref 0–44)
AST: 16 U/L (ref 15–41)
Albumin: 4.6 g/dL (ref 3.5–5.0)
Alkaline Phosphatase: 60 U/L (ref 38–126)
Anion gap: 12 (ref 5–15)
BUN: 8 mg/dL (ref 6–20)
CO2: 24 mmol/L (ref 22–32)
Calcium: 9.5 mg/dL (ref 8.9–10.3)
Chloride: 104 mmol/L (ref 98–111)
Creatinine, Ser: 1.27 mg/dL — ABNORMAL HIGH (ref 0.61–1.24)
GFR, Estimated: >60 mL/min
Glucose, Bld: 99 mg/dL (ref 70–99)
Potassium: 4.1 mmol/L (ref 3.5–5.1)
Sodium: 139 mmol/L (ref 135–145)
Total Bilirubin: 0.5 mg/dL (ref 0.0–1.2)
Total Protein: 8 g/dL (ref 6.5–8.1)

## 2024-01-23 LAB — RESP PANEL BY RT-PCR (RSV, FLU A&B, COVID)  RVPGX2
Influenza A by PCR: NEGATIVE
Influenza B by PCR: NEGATIVE
Resp Syncytial Virus by PCR: NEGATIVE
SARS Coronavirus 2 by RT PCR: NEGATIVE

## 2024-01-23 LAB — PRO BRAIN NATRIURETIC PEPTIDE: Pro Brain Natriuretic Peptide: 48.7 pg/mL

## 2024-01-23 MED ORDER — SUCRALFATE 1 G PO TABS: 1.0000 g | ORAL_TABLET | Freq: Three times a day (TID) | ORAL | 0 refills | Status: AC

## 2024-01-23 MED ORDER — ALBUTEROL SULFATE HFA 108 (90 BASE) MCG/ACT IN AERS: 1.0000 | INHALATION_SPRAY | Freq: Four times a day (QID) | RESPIRATORY_TRACT | 0 refills | Status: AC | PRN

## 2024-01-23 MED ORDER — PANTOPRAZOLE SODIUM 40 MG PO TBEC: 40.0000 mg | DELAYED_RELEASE_TABLET | Freq: Every day | ORAL | 0 refills | Status: AC

## 2024-01-23 MED ORDER — ALUM & MAG HYDROXIDE-SIMETH 200-200-20 MG/5ML PO SUSP
30.0000 mL | Freq: Once | ORAL | Status: AC
  Administered 2024-01-23: 30 mL via ORAL
  Filled 2024-01-23: qty 30

## 2024-01-23 NOTE — ED Provider Notes (Signed)
 [facility] Provider Note  CSN: [id number] Arrival date & time: 01/23/24 0808  Chief Complaint(s) Weakness  HPI Robert Garrett is a 53 y.o. male with past medical history as below, significant for HLD, HTN, sinusitis, who presents to the ED with complaint of nose weakness, dyspnea on exertion  Seen by Dr. Berry Bristol 11/05/2023 with shortness of breath and fatigue over the past year.  Dyspnea favored to be secondary to obesity.  Stress test and echo was ordered which patient completed, normal stress test/normal echo (LVEF 60%)  Patient reports over the past 4 days he has had exertional dyspnea, increased fatigue.  Generalized weakness.  Feels as though his fatigue worsens throughout the day.  He has no chest pain.  He is taking all his regular medications.  Reports he is sleeping normally.  No vomiting or nausea.  No change in bowel or bladder function  He also reports yesterday he had a bloating sensation, burning sensation to his epigastrium and some left shoulder pain.  The symptoms have since resolved.  Did seem to be provoked by food  Past Medical History Past Medical History:  Diagnosis Date   Hypercholesteremia    Hypertension    Patient Active Problem List   Diagnosis Date Noted   Hypokalemia 05/10/2021   Essential hypertension 05/10/2021   Hyperlipidemia 05/10/2021   Renal mass 05/10/2021   Hyponatremia 05/09/2021   Anxiety state 12/09/2007   SINUSITIS, ACUTE 12/09/2007   Allergic rhinitis 12/09/2007   VERTIGO, HX OF 12/09/2007   Home Medication(s) Prior to Admission medications   Medication Sig Start Date End Date Taking? Authorizing Provider  albuterol (VENTOLIN HFA) 108 (90 Base) MCG/ACT inhaler Inhale 1-2 puffs into the lungs every 6 (six) hours as needed for wheezing or shortness of breath. 01/23/24  Yes Russella Courts A, DO  pantoprazole  (PROTONIX ) 40 MG tablet Take 1 tablet (40 mg total) by mouth daily for 14 days. 01/23/24 02/06/24  Yes Russella Courts A, DO  sucralfate (CARAFATE) 1 g tablet Take 1 tablet (1 g total) by mouth with breakfast, with lunch, and with evening meal for 7 days. 01/23/24 01/30/24 Yes Teddi Favors, DO  ADVAIR HFA 45-21 MCG/ACT inhaler Inhale 2 puffs into the lungs 2 (two) times daily. 04/19/21   [provider]  amLODipine  (NORVASC ) 10 MG tablet Take 1 tablet (10 mg total) by mouth daily. 05/12/21   Audria Leather, MD  carvedilol  (COREG ) 12.5 MG tablet Take 1 tablet (12.5 mg total) by mouth 2 (two) times daily. 11/05/23 02/03/24  Knox Perl, MD  fluticasone (FLONASE) 50 MCG/ACT nasal spray Place 1 spray into both nostrils 2 (two) times daily. 04/20/21   [provider]  olmesartan -hydrochlorothiazide (BENICAR  HCT) 20-12.5 MG tablet Take 1 tablet by mouth every morning. 11/05/23   Knox Perl, MD  rosuvastatin  (CRESTOR ) 10 MG tablet Take 0.5 tablets (5 mg total) by mouth at bedtime. 05/11/21   Audria Leather, MD  Past Surgical History Past Surgical History:  Procedure Laterality Date   KIDNEY CYST REMOVAL Left    removal of left kidney Left    sinus surgery     Family History Family History  Problem Relation Age of Onset   Diabetes Mother     Social History Social History   Tobacco Use   Smoking status: Never   Smokeless tobacco: Never  Substance Use Topics   Alcohol use: Never    Comment: rarely   Drug use: No   Allergies Patient has no known allergies.  Review of Systems A thorough review of systems was obtained and all systems are negative except as noted in the HPI and PMH.   Physical Exam Vital Signs  I have reviewed the triage vital signs BP (!) 128/94   Pulse 68   Temp 98.7 F (37.1 C)   Resp 14   Wt 104.3 kg   SpO2 100%   BMI 36.02 kg/m  Physical Exam Vitals and nursing note reviewed.  Constitutional:      General: He is not in  acute distress.    Appearance: He is well-developed. He is obese. He is not ill-appearing.  HENT:     Head: Normocephalic and atraumatic.     Right Ear: External ear normal.     Left Ear: External ear normal.     Mouth/Throat:     Mouth: Mucous membranes are moist.  Eyes:     General: No scleral icterus. Cardiovascular:     Rate and Rhythm: Normal rate and regular rhythm.     Pulses: Normal pulses.     Heart sounds: Normal heart sounds.  Pulmonary:     Effort: Pulmonary effort is normal. No respiratory distress.     Breath sounds: Normal breath sounds.  Abdominal:     General: Abdomen is flat.     Palpations: Abdomen is soft.     Tenderness: There is no abdominal tenderness.  Musculoskeletal:     Cervical back: No rigidity.     Right lower leg: No edema.     Left lower leg: No edema.  Skin:    General: Skin is warm and dry.     Capillary Refill: Capillary refill takes less than 2 seconds.  Neurological:     Mental Status: He is alert.  Psychiatric:        Mood and Affect: Mood normal.        Behavior: Behavior normal.     ED Results and Treatments Labs (all labs ordered are listed, but only abnormal results are displayed) Labs Reviewed  COMPREHENSIVE METABOLIC PANEL WITH GFR - Abnormal; Notable for the following components:      Result Value   Creatinine, Ser 1.27 (*)    All other components within normal limits  RESP PANEL BY RT-PCR (RSV, FLU A&B, COVID)  RVPGX2  CBC WITH DIFFERENTIAL/PLATELET  LIPASE, BLOOD  URINALYSIS, ROUTINE W REFLEX MICROSCOPIC  PRO BRAIN NATRIURETIC PEPTIDE  TROPONIN T, HIGH SENSITIVITY  Radiology DG Chest 2 View Result Date: 01/23/2024 CLINICAL DATA:  Shortness of breath EXAM: CHEST - 2 VIEW COMPARISON:  None Available. FINDINGS: The heart size and mediastinal contours are within normal limits. Both lungs are clear. The  visualized skeletal structures are unremarkable. IMPRESSION: No active cardiopulmonary disease. Electronically Signed   By: Fredrich Jefferson M.D.   On: 01/23/2024 09:03    Pertinent labs & imaging results that were available during my care of the patient were reviewed by me and considered in my medical decision making (see MDM for details).  Medications Ordered in ED Medications  alum & mag hydroxide-simeth (MAALOX/MYLANTA) 200-200-20 MG/5ML suspension 30 mL (30 mLs Oral Given 01/23/24 0926)                                                                                                                                     Procedures Procedures  (including critical care time)  Medical Decision Making / ED Course    Medical Decision Making:    Robert Garrett is a 53 y.o. male with past medical history as below, significant for HLD, HTN, sinusitis, who presents to the ED with complaint of nose weakness, dyspnea on exertion. The complaint involves an extensive differential diagnosis and also carries with it a high risk of complications and morbidity.  Serious etiology was considered. Ddx includes but is not limited to: In my evaluation of this patient's dyspnea my DDx includes, but is not limited to, pneumonia, pulmonary embolism, pneumothorax, pulmonary edema, metabolic acidosis, asthma, COPD, cardiac cause, anemia, anxiety, etc.  Differential diagnosis includes but is not exclusive to acute cholecystitis, intrathoracic causes for epigastric abdominal pain, gastritis, duodenitis, pancreatitis, small bowel or large bowel obstruction, abdominal aortic aneurysm, hernia, gastritis, etc.   Complete initial physical exam performed, notably the patient was in no acute distress, HDS.    Reviewed and confirmed nursing documentation for past medical history, family history, social history.  Vital signs reviewed.     Brief summary:  53 year old male history as above here with fatigue, exertional dyspnea,  generalized weakness, bloating, gassy sensation no dyspnea at rest, no orthopnea or leg swelling, no chest pain; currently asymptomatic   Clinical Course as of 01/23/24 1323  Thu Jan 23, 2024  0923 Ambulatory w/o assist  [SG]  1139 Workup stable [SG]  1323 Creatinine(!): 1.27 Similar prior  [SG]    Clinical Course User Index [SG] Russella Courts A, DO     Workup stable Feeling better Tolerating PO w/o difficulty Ambulatory pulse ox completed, did have some increased WOB on second lap around the ER. Recent echo/stress test which was stable Start ppi, give albuterol inhaler, advised close pcp f/u and gi in 2 wks if upper gi symptoms persist, +/- egd  Patient in no distress and overall condition improved here in the ED. Detailed discussions were had with the patient/guardian regarding current findings, and need for close f/u with  PCP or on call doctor. The patient/guardian has been instructed to return immediately if the symptoms worsen in any way for re-evaluation. Patient/guardian verbalized understanding and is in agreement with current care plan. All questions answered prior to discharge.              Additional history obtained: -Additional history obtained from na -External records from outside source obtained and reviewed including: Chart review including previous notes, labs, imaging, consultation notes including  Prior echocardiogram, cardiology documentation, home medications   Lab Tests: -I ordered, reviewed, and interpreted labs.   The pertinent results include:   Labs Reviewed  COMPREHENSIVE METABOLIC PANEL WITH GFR - Abnormal; Notable for the following components:      Result Value   Creatinine, Ser 1.27 (*)    All other components within normal limits  RESP PANEL BY RT-PCR (RSV, FLU A&B, COVID)  RVPGX2  CBC WITH DIFFERENTIAL/PLATELET  LIPASE, BLOOD  URINALYSIS, ROUTINE W REFLEX MICROSCOPIC  PRO BRAIN NATRIURETIC PEPTIDE  TROPONIN T, HIGH SENSITIVITY     Notable for labs stable  EKG   EKG Interpretation Date/Time:  Thursday January 23 2024 08:35:07 EDT Ventricular Rate:  72 PR Interval:  195 QRS Duration:  97 QT Interval:  384 QTC Calculation: 421 R Axis:   97  Text Interpretation: Sinus rhythm Borderline right axis deviation ST elev, probable normal early repol pattern similar to prior 3/25 Confirmed by Russella Courts ([id number]) on 01/23/2024 8:41:11 AM         Imaging Studies ordered: I ordered imaging studies including chest x-ray I independently visualized the following imaging with scope of interpretation limited to determining acute life threatening conditions related to emergency care; findings noted above I agree with the radiologist interpretation If any imaging was obtained with contrast I closely monitored patient for any possible adverse reaction a/w contrast administration in the emergency department   Medicines ordered and prescription drug management: Meds ordered this encounter  Medications   alum & mag hydroxide-simeth (MAALOX/MYLANTA) 200-200-20 MG/5ML suspension 30 mL   pantoprazole  (PROTONIX ) 40 MG tablet    Sig: Take 1 tablet (40 mg total) by mouth daily for 14 days.    Dispense:  14 tablet    Refill:  0   sucralfate (CARAFATE) 1 g tablet    Sig: Take 1 tablet (1 g total) by mouth with breakfast, with lunch, and with evening meal for 7 days.    Dispense:  21 tablet    Refill:  0   albuterol (VENTOLIN HFA) 108 (90 Base) MCG/ACT inhaler    Sig: Inhale 1-2 puffs into the lungs every 6 (six) hours as needed for wheezing or shortness of breath.    Dispense:  8.5 each    Refill:  0    -I have reviewed the patients home medicines and have made adjustments as needed   Consultations Obtained: na   Cardiac Monitoring: The patient was maintained on a cardiac monitor.  I personally viewed and interpreted the cardiac monitored which showed an underlying rhythm of: nsr Continuous pulse oximetry interpreted by myself,  100% on ra.    Social Determinants of Health:  Diagnosis or treatment significantly limited by social determinants of health: obesity   Reevaluation: After the interventions noted above, I reevaluated the patient and found that they have improved  Co morbidities that complicate the patient evaluation  Past Medical History:  Diagnosis Date   Hypercholesteremia    Hypertension       Dispostion: Disposition decision including need  for hospitalization was considered, and patient discharged from emergency department.    Final Clinical Impression(s) / ED Diagnoses Final diagnoses:  Exertional dyspnea  Indigestion        Teddi Favors, DO 01/23/24 1324

## 2024-01-23 NOTE — ED Notes (Signed)
Urine specimen in lab 

## 2024-01-23 NOTE — Discharge Instructions (Signed)
 It was a pleasure caring for you today in the emergency department.  Be sure to eat a bland diet, get plenty of rest, follow up with your pcp for recheck. If burning sensation continues after 2 wks please call GI Dr to arrange follow up.   Please return to the emergency department for any worsening or worrisome symptoms.

## 2024-01-23 NOTE — ED Triage Notes (Signed)
 Pt presents with generalized weakness x 4 days. Bloating Denies N/V/D  Complains of left posterior shoulder burning. No CP or SOB

## 2024-01-23 NOTE — ED Notes (Addendum)
 Patient ambulated 2 times around unit 1st round patients 02 varied from 94-97%/Pulse varied from 97-122 2nd round patients 02 varied from 90-93%/Pulse varied from  90-109

## 2024-01-24 DIAGNOSIS — R1013 Epigastric pain: Secondary | ICD-10-CM | POA: Diagnosis not present

## 2024-01-24 DIAGNOSIS — R5383 Other fatigue: Secondary | ICD-10-CM | POA: Diagnosis not present

## 2024-01-24 DIAGNOSIS — M546 Pain in thoracic spine: Secondary | ICD-10-CM | POA: Diagnosis not present

## 2024-01-28 DIAGNOSIS — K219 Gastro-esophageal reflux disease without esophagitis: Secondary | ICD-10-CM | POA: Diagnosis not present

## 2024-01-28 DIAGNOSIS — Z86018 Personal history of other benign neoplasm: Secondary | ICD-10-CM | POA: Diagnosis not present

## 2024-01-28 DIAGNOSIS — Z09 Encounter for follow-up examination after completed treatment for conditions other than malignant neoplasm: Secondary | ICD-10-CM | POA: Diagnosis not present

## 2024-01-28 DIAGNOSIS — R140 Abdominal distension (gaseous): Secondary | ICD-10-CM | POA: Diagnosis not present

## 2024-02-26 DIAGNOSIS — D124 Benign neoplasm of descending colon: Secondary | ICD-10-CM | POA: Diagnosis not present

## 2024-02-26 DIAGNOSIS — K635 Polyp of colon: Secondary | ICD-10-CM | POA: Diagnosis not present

## 2024-02-26 DIAGNOSIS — K5730 Diverticulosis of large intestine without perforation or abscess without bleeding: Secondary | ICD-10-CM | POA: Diagnosis not present

## 2024-02-26 DIAGNOSIS — Z860100 Personal history of colon polyps, unspecified: Secondary | ICD-10-CM | POA: Diagnosis not present

## 2024-02-26 DIAGNOSIS — D123 Benign neoplasm of transverse colon: Secondary | ICD-10-CM | POA: Diagnosis not present

## 2024-02-26 DIAGNOSIS — D128 Benign neoplasm of rectum: Secondary | ICD-10-CM | POA: Diagnosis not present

## 2024-02-26 DIAGNOSIS — Z09 Encounter for follow-up examination after completed treatment for conditions other than malignant neoplasm: Secondary | ICD-10-CM | POA: Diagnosis not present

## 2024-06-29 DIAGNOSIS — R7303 Prediabetes: Secondary | ICD-10-CM | POA: Diagnosis not present
